# Patient Record
Sex: Male | Born: 1940 | Race: White | Hispanic: No | Marital: Married | Smoking: Former smoker
Health system: Southern US, Community
[De-identification: ages and names within clinical notes are randomized; demographics above are authoritative.]

## PROBLEM LIST (undated history)

## (undated) DIAGNOSIS — C801 Malignant (primary) neoplasm, unspecified: Secondary | ICD-10-CM

## (undated) HISTORY — PX: INGUINAL HERNIA REPAIR: SUR1180

---

## 2007-07-24 ENCOUNTER — Ambulatory Visit: Admission: RE | Admit: 2007-07-24 | Discharge: 2007-10-22 | Payer: Self-pay | Admitting: Radiation Oncology

## 2007-08-19 ENCOUNTER — Encounter: Admission: RE | Admit: 2007-08-19 | Discharge: 2007-08-19 | Payer: Self-pay | Admitting: Urology

## 2007-09-11 ENCOUNTER — Ambulatory Visit (HOSPITAL_BASED_OUTPATIENT_CLINIC_OR_DEPARTMENT_OTHER): Admission: RE | Admit: 2007-09-11 | Discharge: 2007-09-11 | Payer: Self-pay | Admitting: Urology

## 2010-06-28 NOTE — Op Note (Signed)
Ryan Meyers, Ryan Meyers             ACCOUNT NO.:  [id number]   MEDICAL RECORD NO.:  [id number]          PATIENT TYPE:  AMB   LOCATION:  [facility]                         FACILITY:  [facility]   PHYSICIAN:  Mark C. Vernie Ammons, M.D.  DATE OF BIRTH:  03/07/1940   DATE OF PROCEDURE:  09/11/2007  DATE OF DISCHARGE:                               OPERATIVE REPORT   PREOPERATIVE DIAGNOSIS:  Adenocarcinoma of the prostate.   POSTOPERATIVE DIAGNOSIS:  Adenocarcinoma of the prostate.   PROCEDURE:  I-125 seed implantation.   SURGEON:  Mark C. Vernie Ammons, M.D.   RADIATION ONCOLOGIST:  Maryln Gottron, M.D.   ANESTHESIA:  General.   SPECIMENS:  None.   DRAINS:  16-French Foley catheter.   BLOOD LOSS:  Minimal.   COMPLICATIONS:  None.   INDICATIONS:  The patient is a 70 year old white male with biopsy-proven  adenocarcinoma of the prostate after a PSA of 6 was detected.  His DRE  revealed no nodularity.  The biopsy revealed adenocarcinoma, Gleason  score 3+3 in all of the cores that were positive.  The patient was  counseled on the treatment options as well as risks and complications  associated with seed implantation and has elected to proceed with the  above-named procedure.   DESCRIPTION OF OPERATION:  After informed consent, the patient was  brought to the major O.R., placed on the table, and administered general  anesthesia, then moved to the modified lithotomy position, with his  perineum perpendicular to the floor.  A rectal tube was placed, as well  as Foley catheter, and the balloon was filled with dilute contrast.  Next, the transrectal ultrasound probe was placed in the rectum and then  affixed to the stand.  Real-time ultrasound planning was then performed  by Dr. Dayton Scrape.  After the plan was complete, I proceeded with seed  implantation.   The seeds were implanted according to the real-time plan previously  performed using the grid and real-time ultrasound.  The Nucletron was  used to  build and dispense the seeds through the needles.  This  proceeded without difficulty or complication.  After implanting a total  of 75 seeds using 35 needles, the anchoring needles were removed, and  the transrectal ultrasound probe was removed from the rectum as well as  the rectal tube.  Fluoroscopy was used to observe placement and location  of the seeds.  The Foley catheter was then removed as well.   Flexible cystoscopy was then performed using a 17-French flexible scope.  This was passed per urethra under direct visualization, which was noted  to be normal.  The sphincter appeared intact.  The prostate revealed  elongation of the prostatic urethra, but there were no prostatic  urethral lesions.  No seeds were identified within the prostatic  urethra.  Upon entering the bladder, I note the bladder was lined with  normal-appearing pink mucosa and was fully and systematically inspected.  No tumor, stones, or inflammatory lesions were identified.  Ureteral  orifices were noted to be of normal configuration and position.  Retroflexion of the scope revealed no seeds protruding from the base  of  the prostate or other abnormality.  The cystoscope was therefore  removed, and a new 16-French Foley catheter was inserted in the bladder  and connected to closed-system drainage.  A sterile occlusive dressing  was applied to the perineum, and the patient was awakened and taken to  the recovery room in stable, satisfactory condition.  He tolerated the  procedure well, with no intraoperative complications.   He will be given a prescription for 24 Vicodin; Flomax 0.4 mg, #30; and  500 mg Cipro to take b.i.d. for 5 days.  He will follow up in my office  and with Dr. Dayton Scrape in 3 weeks, and will remove his Foley catheter at  home.  Written instructions outlining this were given to the patient  upon discharge.      Mark C. Vernie Ammons, M.D.  Electronically Signed     MCO/MEDQ  D:  09/11/2007  T:   09/11/2007  Job:  [id number]   cc:   Maryln Gottron, M.D.  Fax: [phone]

## 2010-11-11 LAB — CBC
HCT: 42.7
Hemoglobin: 14.7
MCHC: 34.4
MCV: 88.8
Platelets: 170
RBC: 4.81
RDW: 11.9
WBC: 5.4

## 2010-11-11 LAB — COMPREHENSIVE METABOLIC PANEL
ALT: 18
AST: 17
Albumin: 4
Alkaline Phosphatase: 76
BUN: 11
CO2: 30
Calcium: 9.5
Chloride: 106
Creatinine, Ser: 0.86
GFR calc Af Amer: >60
GFR calc non Af Amer: >60
Glucose, Bld: 102 — ABNORMAL HIGH
Potassium: 5.2 — ABNORMAL HIGH
Sodium: 139
Total Bilirubin: 0.9
Total Protein: 6

## 2010-11-11 LAB — PROTIME-INR
INR: 1
Prothrombin Time: 13.5

## 2010-11-11 LAB — APTT: aPTT: 34

## 2012-03-01 DIAGNOSIS — Z8546 Personal history of malignant neoplasm of prostate: Secondary | ICD-10-CM | POA: Diagnosis not present

## 2012-03-08 DIAGNOSIS — Z8546 Personal history of malignant neoplasm of prostate: Secondary | ICD-10-CM | POA: Diagnosis not present

## 2012-07-12 DIAGNOSIS — Z79899 Other long term (current) drug therapy: Secondary | ICD-10-CM | POA: Diagnosis not present

## 2012-07-12 DIAGNOSIS — E7800 Pure hypercholesterolemia, unspecified: Secondary | ICD-10-CM | POA: Diagnosis not present

## 2012-07-12 DIAGNOSIS — Z0000 Encounter for general adult medical examination without abnormal findings: Secondary | ICD-10-CM | POA: Diagnosis not present

## 2012-09-16 DIAGNOSIS — H25099 Other age-related incipient cataract, unspecified eye: Secondary | ICD-10-CM | POA: Diagnosis not present

## 2012-09-16 DIAGNOSIS — H18419 Arcus senilis, unspecified eye: Secondary | ICD-10-CM | POA: Diagnosis not present

## 2012-10-07 DIAGNOSIS — D126 Benign neoplasm of colon, unspecified: Secondary | ICD-10-CM | POA: Diagnosis not present

## 2012-10-07 DIAGNOSIS — Z86010 Personal history of colonic polyps: Secondary | ICD-10-CM | POA: Diagnosis not present

## 2012-10-07 DIAGNOSIS — Z09 Encounter for follow-up examination after completed treatment for conditions other than malignant neoplasm: Secondary | ICD-10-CM | POA: Diagnosis not present

## 2012-10-07 DIAGNOSIS — K648 Other hemorrhoids: Secondary | ICD-10-CM | POA: Diagnosis not present

## 2013-03-07 DIAGNOSIS — Z8546 Personal history of malignant neoplasm of prostate: Secondary | ICD-10-CM | POA: Diagnosis not present

## 2013-03-14 DIAGNOSIS — Z8546 Personal history of malignant neoplasm of prostate: Secondary | ICD-10-CM | POA: Diagnosis not present

## 2013-07-18 DIAGNOSIS — Z0000 Encounter for general adult medical examination without abnormal findings: Secondary | ICD-10-CM | POA: Diagnosis not present

## 2013-07-18 DIAGNOSIS — D485 Neoplasm of uncertain behavior of skin: Secondary | ICD-10-CM | POA: Diagnosis not present

## 2013-07-18 DIAGNOSIS — Z23 Encounter for immunization: Secondary | ICD-10-CM | POA: Diagnosis not present

## 2013-07-18 DIAGNOSIS — E7800 Pure hypercholesterolemia, unspecified: Secondary | ICD-10-CM | POA: Diagnosis not present

## 2013-07-18 DIAGNOSIS — Z8546 Personal history of malignant neoplasm of prostate: Secondary | ICD-10-CM | POA: Diagnosis not present

## 2013-09-05 DIAGNOSIS — H25099 Other age-related incipient cataract, unspecified eye: Secondary | ICD-10-CM | POA: Diagnosis not present

## 2014-03-09 DIAGNOSIS — Z8546 Personal history of malignant neoplasm of prostate: Secondary | ICD-10-CM | POA: Diagnosis not present

## 2014-03-16 DIAGNOSIS — Z8546 Personal history of malignant neoplasm of prostate: Secondary | ICD-10-CM | POA: Diagnosis not present

## 2014-06-25 DIAGNOSIS — M7021 Olecranon bursitis, right elbow: Secondary | ICD-10-CM | POA: Diagnosis not present

## 2014-06-25 DIAGNOSIS — L03119 Cellulitis of unspecified part of limb: Secondary | ICD-10-CM | POA: Diagnosis not present

## 2014-06-30 DIAGNOSIS — M7021 Olecranon bursitis, right elbow: Secondary | ICD-10-CM | POA: Diagnosis not present

## 2014-07-06 DIAGNOSIS — M7021 Olecranon bursitis, right elbow: Secondary | ICD-10-CM | POA: Diagnosis not present

## 2014-07-22 DIAGNOSIS — E780 Pure hypercholesterolemia: Secondary | ICD-10-CM | POA: Diagnosis not present

## 2014-07-22 DIAGNOSIS — Z0000 Encounter for general adult medical examination without abnormal findings: Secondary | ICD-10-CM | POA: Diagnosis not present

## 2014-08-29 ENCOUNTER — Emergency Department (HOSPITAL_COMMUNITY): Payer: Medicare Other

## 2014-08-29 ENCOUNTER — Encounter (HOSPITAL_COMMUNITY): Payer: Self-pay | Admitting: *Deleted

## 2014-08-29 ENCOUNTER — Observation Stay (HOSPITAL_COMMUNITY)
Admission: EM | Admit: 2014-08-29 | Discharge: 2014-08-30 | Disposition: A | Discharge status: Home / Self Care | Payer: Medicare Other | Attending: Internal Medicine | Admitting: Internal Medicine

## 2014-08-29 DIAGNOSIS — R079 Chest pain, unspecified: Secondary | ICD-10-CM | POA: Diagnosis not present

## 2014-08-29 DIAGNOSIS — IMO0001 Reserved for inherently not codable concepts without codable children: Secondary | ICD-10-CM | POA: Diagnosis present

## 2014-08-29 DIAGNOSIS — E041 Nontoxic single thyroid nodule: Secondary | ICD-10-CM

## 2014-08-29 DIAGNOSIS — R0789 Other chest pain: Secondary | ICD-10-CM | POA: Diagnosis not present

## 2014-08-29 DIAGNOSIS — E785 Hyperlipidemia, unspecified: Secondary | ICD-10-CM | POA: Diagnosis not present

## 2014-08-29 DIAGNOSIS — R030 Elevated blood-pressure reading, without diagnosis of hypertension: Secondary | ICD-10-CM | POA: Diagnosis not present

## 2014-08-29 DIAGNOSIS — Z8546 Personal history of malignant neoplasm of prostate: Secondary | ICD-10-CM | POA: Insufficient documentation

## 2014-08-29 DIAGNOSIS — J439 Emphysema, unspecified: Secondary | ICD-10-CM | POA: Diagnosis present

## 2014-08-29 DIAGNOSIS — J8410 Pulmonary fibrosis, unspecified: Secondary | ICD-10-CM | POA: Diagnosis not present

## 2014-08-29 DIAGNOSIS — M549 Dorsalgia, unspecified: Secondary | ICD-10-CM | POA: Diagnosis present

## 2014-08-29 HISTORY — DX: Malignant (primary) neoplasm, unspecified: C80.1

## 2014-08-29 LAB — CBC
HCT: 43.6 % (ref 39.0–52.0)
HEMOGLOBIN: 14.9 g/dL (ref 13.0–17.0)
MCH: 30.7 pg (ref 26.0–34.0)
MCHC: 34.2 g/dL (ref 30.0–36.0)
MCV: 89.7 fL (ref 78.0–100.0)
Platelets: 164 10*3/uL (ref 150–400)
RBC: 4.86 MIL/uL (ref 4.22–5.81)
RDW: 12.3 % (ref 11.5–15.5)
WBC: 7 10*3/uL (ref 4.0–10.5)

## 2014-08-29 LAB — BASIC METABOLIC PANEL
Anion gap: 5 (ref 5–15)
BUN: 18 mg/dL (ref 6–20)
CO2: 28 mmol/L (ref 22–32)
Calcium: 9.1 mg/dL (ref 8.9–10.3)
Chloride: 109 mmol/L (ref 101–111)
Creatinine, Ser: 1.08 mg/dL (ref 0.61–1.24)
GFR calc Af Amer: >60 mL/min (ref >60)
GLUCOSE: 120 mg/dL — AB (ref 65–99)
POTASSIUM: 4.3 mmol/L (ref 3.5–5.1)
SODIUM: 142 mmol/L (ref 135–145)

## 2014-08-29 LAB — I-STAT TROPONIN, ED
Troponin i, poc: 0 ng/mL (ref 0.00–0.08)
Troponin i, poc: 0 ng/mL (ref 0.00–0.08)

## 2014-08-29 LAB — D-DIMER, QUANTITATIVE: D-Dimer, Quant: 0.87 ug/mL-FEU — ABNORMAL HIGH (ref 0.00–0.48)

## 2014-08-29 MED ORDER — IOHEXOL 350 MG/ML SOLN
80.0000 mL | Freq: Once | INTRAVENOUS | Status: AC | PRN
  Administered 2014-08-29: 80 mL via INTRAVENOUS

## 2014-08-29 NOTE — ED Provider Notes (Signed)
CSN: [id number]     Arrival date & time 08/29/14  1533 History   First MD Initiated Contact with Patient 08/29/14 1745     Chief Complaint  Patient presents with  . Back Pain     (Consider location/radiation/quality/duration/timing/severity/associated sxs/prior Treatment) HPI Patient presents to the emergency department with chest pressure and started this afternoon while at home.  The patient states that he has had intermittent chest pressure this afternoon.  He has had some back pain just below the scapula started with tingling in his left arm today.  The patient states that nothing seems make his condition, better or worse.  Patient denies shortness breath, nausea, vomiting, diaphoresis, weakness, dizziness, lightheadedness, abdominal pain, bloody stool, hematemesis, or syncope.  The patient states that nothing seems make the condition better or worse.  Patient states his last stress test was in the late 80s, early 90s Past Medical History  Diagnosis Date  . Cancer     prostate cancer in 2009   History reviewed. No pertinent past surgical history. No family history on file. History  Substance Use Topics  . Smoking status: Never Smoker   . Smokeless tobacco: Not on file  . Alcohol Use: No    Review of Systems All other systems negative except as documented in the HPI. All pertinent positives and negatives as reviewed in the HPI.    Allergies  Review of patient's allergies indicates no known allergies.  Home Medications   Prior to Admission medications   Not on File   BP 140/65 mmHg  Pulse 66  Temp(Src) 97.8 F (36.6 C) (Oral)  Resp 23  Ht 5\' 9"  (1.753 m)  Wt 150 lb (68.04 kg)  BMI 22.14 kg/m2  SpO2 98% Physical Exam  Constitutional: He is oriented to person, place, and time. He appears well-developed and well-nourished. No distress.  HENT:  Head: Normocephalic and atraumatic.  Mouth/Throat: Oropharynx is clear and moist.  Eyes: Pupils are equal, round, and  reactive to light.  Neck: Normal range of motion. Neck supple.  Cardiovascular: Normal rate, regular rhythm and normal heart sounds.  Exam reveals no gallop and no friction rub.   No murmur heard. Pulmonary/Chest: Effort normal and breath sounds normal. No respiratory distress.  Musculoskeletal: He exhibits no edema.  Neurological: He is alert and oriented to person, place, and time. He exhibits normal muscle tone. Coordination normal.  Skin: Skin is warm and dry. No rash noted. No erythema.  Psychiatric: He has a normal mood and affect. His behavior is normal.  Nursing note and vitals reviewed.   ED Course  Procedures (including critical care time) Labs Review Labs Reviewed  BASIC METABOLIC PANEL - Abnormal; Notable for the following:    Glucose, Bld 120 (*)    All other components within normal limits  D-DIMER, QUANTITATIVE (NOT AT [facility]) - Abnormal; Notable for the following:    D-Dimer, Quant 0.87 (*)    All other components within normal limits  CBC  I-STAT TROPOININ, ED  Randolm Idol, ED    Imaging Review Dg Chest 2 View  08/29/2014   CLINICAL DATA:  74 year old male with posterior chest tightness, pain. Tingling in the left upper extremity. Initial encounter.  EXAM: CHEST  2 VIEW  COMPARISON:  08/19/2007.  FINDINGS: Stable lung volumes. Cardiac size is at the upper limits of normal. Other mediastinal contours are within normal limits. Visualized tracheal air column is within normal limits. No pneumothorax, pulmonary edema, pleural effusion or confluent pulmonary opacity. Small surgical  clips at the right anterior chest wall or anterior right lung. No acute osseous abnormality identified.  IMPRESSION: No acute cardiopulmonary abnormality.   Electronically Signed   By: Genevie Ann M.D.   On: 08/29/2014 18:00       EKG Interpretation  Date/Time:  Saturday August 29 2014 23:00:01 EDT Ventricular Rate:  64 PR Interval:  140 QRS Duration: 87 QT Interval:  392 QTC  Calculation: 404 R Axis:   -52 Text Interpretation:  Sinus rhythm Left anterior fascicular block No significant change since last tracing Confirmed by KNAPP  MD-J, JON ([id number]) on 08/29/2014 11:31:40 PM        Patient be admitted for cardiac rule out.  Patient is advised plan and all questions were answered  Dalia Heading, PA-C 08/29/14 2355

## 2014-08-29 NOTE — H&P (Signed)
PCP: Donnie Coffin, MD  [facility] [place]  Referring provider lawyer   Chief Complaint:   Chest and back pain  HPI: Ryan Meyers is a 74 y.o. male   has a past medical history of Cancer.   Presented with  Back and Chest pain. Patient started to have back pain under left shoulder blade for 1 week. He has had a dull ache there.  Around same time he developed chest tightness like indigestion. The sensation comes and goes. Occurs at rest not worse with activity, not worse with deep breathing. Lasts less than a minute. He quit smoking 30 years ago. Father died of massive MI in his 39's. Patient presented to ER . Troponin 0.00 x2. CTA no PE by showing emphysema and thyroid nodule.  Denies any fever no chills, no nausea no vomiting.   Patient has history of prostate cancer treated by urology status post seed implantation in 2009  Hospitalist was called for admission for atypical  chest pain with Heart score   Review of Systems:    Pertinent positives include:  chest pain, back pain  Constitutional:  No weight loss, night sweats, Fevers, chills, fatigue, weight loss  HEENT:  No headaches, Difficulty swallowing,Tooth/dental problems,Sore throat,  No sneezing, itching, ear ache, nasal congestion, post nasal drip,  Cardio-vascular:  No  Orthopnea, PND, anasarca, dizziness, palpitations.no Bilateral lower extremity swelling  GI:  No heartburn, indigestion, abdominal pain, nausea, vomiting, diarrhea, change in bowel habits, loss of appetite, melena, blood in stool, hematemesis Resp:  no shortness of breath at rest. No dyspnea on exertion, No excess mucus, no productive cough, No non-productive cough, No coughing up of blood.No change in color of mucus.No wheezing. Skin:  no rash or lesions. No jaundice GU:  no dysuria, change in color of urine, no urgency or frequency. No straining to urinate.  No flank pain.  Musculoskeletal:  No joint pain or no joint swelling. No decreased  range of motion. No back pain.  Psych:  No change in mood or affect. No depression or anxiety. No memory loss.  Neuro: no localizing neurological complaints, no tingling, no weakness, no double vision, no gait abnormality, no slurred speech, no confusion  Otherwise ROS are negative except for above, 10 systems were reviewed  Past Medical History: Past Medical History  Diagnosis Date  . Cancer     prostate cancer in 2009   History reviewed. No pertinent past surgical history.   Medications: Prior to Admission medications   Medication Sig Start Date End Date Taking? Authorizing Provider  simvastatin (ZOCOR) 20 MG tablet Take 20 mg by mouth at bedtime.   Yes Historical Provider, MD    Allergies:  No Known Allergies  Social History:  Ambulatory   independently   Lives at home   With family     reports that he has never smoked. He does not have any smokeless tobacco history on file. He reports that he does not drink alcohol or use illicit drugs.    Family History: family history is not on file.    Physical Exam: Patient Vitals for the past 24 hrs:  BP Temp Temp src Pulse Resp SpO2 Height Weight  08/29/14 1900 140/65 mmHg - - 66 23 98 % - -  08/29/14 1800 154/75 mmHg - - 82 23 100 % - -  08/29/14 1547 174/78 mmHg 97.8 F (36.6 C) Oral 94 18 100 % 5\' 9"  (1.753 m) 68.04 kg (150 lb)    1. General:  in No Acute distress 2. Psychological: Alert and    Oriented 3. Head/ENT:     Dry Mucous Membranes                          Head Non traumatic, neck supple                          Normal   Dentition 4. SKIN: normal  Skin turgor,  Skin clean Dry and intact no rash 5. Heart: Regular rate and rhythm no Murmur, Rub or gallop 6. Lungs:   no wheezes or crackles  slightly distant 7. Abdomen: Soft, non-tender, Non distended 8. Lower extremities: no clubbing, cyanosis, or edema 9. Neurologically Grossly intact, moving all 4 extremities equally 10. MSK: Normal range of motion back  pain reproducible by palpation  body mass index is 22.14 kg/(m^2).   Labs on Admission:   Results for orders placed or performed during the hospital encounter of 08/29/14 (from the past 24 hour(s))  Basic metabolic panel     Status: Abnormal   Collection Time: 08/29/14  4:05 PM  Result Value Ref Range   Sodium 142 135 - 145 mmol/L   Potassium 4.3 3.5 - 5.1 mmol/L   Chloride 109 101 - 111 mmol/L   CO2 28 22 - 32 mmol/L   Glucose, Bld 120 (H) 65 - 99 mg/dL   BUN 18 6 - 20 mg/dL   Creatinine, Ser 1.08 0.61 - 1.24 mg/dL   Calcium 9.1 8.9 - 10.3 mg/dL   GFR calc non Af Amer >60 >60 mL/min   GFR calc Af Amer >60 >60 mL/min   Anion gap 5 5 - 15  CBC     Status: None   Collection Time: 08/29/14  4:05 PM  Result Value Ref Range   WBC 7.0 4.0 - 10.5 K/uL   RBC 4.86 4.22 - 5.81 MIL/uL   Hemoglobin 14.9 13.0 - 17.0 g/dL   HCT 43.6 39.0 - 52.0 %   MCV 89.7 78.0 - 100.0 fL   MCH 30.7 26.0 - 34.0 pg   MCHC 34.2 30.0 - 36.0 g/dL   RDW 12.3 11.5 - 15.5 %   Platelets 164 150 - 400 K/uL  D-dimer, quantitative     Status: Abnormal   Collection Time: 08/29/14  6:35 PM  Result Value Ref Range   D-Dimer, Quant 0.87 (H) 0.00 - 0.48 ug/mL-FEU  I-stat troponin, ED     Status: None   Collection Time: 08/29/14  6:46 PM  Result Value Ref Range   Troponin i, poc 0.00 0.00 - 0.08 ng/mL   Comment 3          I-stat troponin, ED     Status: None   Collection Time: 08/29/14 10:08 PM  Result Value Ref Range   Troponin i, poc 0.00 0.00 - 0.08 ng/mL   Comment 3            U not obtained  No results found for: HGBA1C  Estimated Creatinine Clearance: 60.3 mL/min (by C-G formula based on Cr of 1.08).  BNP (last 3 results) No results for input(s): PROBNP in the last 8760 hours.  Other results:  I have pearsonaly reviewed this: ECG REPORT  Rate: 64  Rhythm: Left anterior fascicular block ST&T Change: no ischemic changes QTC 404   Filed Weights   08/29/14 1547  Weight: 68.04 kg (150 lb)  Cultures: No results found for: SDES, SPECREQUEST, CULT, REPTSTATUS   Radiological Exams on Admission: Dg Chest 2 View  08/29/2014   CLINICAL DATA:  74 year old male with posterior chest tightness, pain. Tingling in the left upper extremity. Initial encounter.  EXAM: CHEST  2 VIEW  COMPARISON:  08/19/2007.  FINDINGS: Stable lung volumes. Cardiac size is at the upper limits of normal. Other mediastinal contours are within normal limits. Visualized tracheal air column is within normal limits. No pneumothorax, pulmonary edema, pleural effusion or confluent pulmonary opacity. Small surgical clips at the right anterior chest wall or anterior right lung. No acute osseous abnormality identified.  IMPRESSION: No acute cardiopulmonary abnormality.   Electronically Signed   By: Genevie Ann M.D.   On: 08/29/2014 18:00    Chart has been reviewed  Family   at  Bedside  plan of care was discussed with Wife Randale Carvalho [phone]  Assessment/Plan  74 year old gentleman with history of prostate cancer and hyperlipidemia with family history of massive MI in his father and and his 43s. Presents with atypical chest pain. Troponins negative EKG without acute changes. Heart score 4 been admitted for further evaluation. CT scan of the chest incidentally found diffuse emphysema and thyroid nodule     Present on Admission:  . Chest pain - somewhat atypical - given risk factors will admit, monitor on telemetry, cycle cardiac enzymes, obtain serial ECG. Further risk stratify with lipid panel, hgA1C, obtain TSH. Make sure patient is on Aspirin. Further treatment based on the currently pending results.  . Hyperlipidemia - check lipid panel continuous patent  . Thyroid nodule check TSH, T4, and T3, soft tissue ultrasound of the neck  . Elevated BP - continue to follow if persists will need initiation of antihypertensives  . Emphysema of lung - she'll former smoker currently has been tobacco free for the past 30  years. Reports no wheezing or shortness of breath. No oxygen requirement. Continue to monitor   back pain -  appears to be musculoskeletal  Prophylaxis:  Lovenox   CODE STATUS:  FULL CODE  as per patient   Disposition: To home once workup is complete and patient is stable  Other plan as per orders.  I have spent a total of 55 min on this admission  Moris Ratchford 08/29/2014, 11:02 PM  [facility]  Pager [phone]   after 2 AM please page floor coverage PA If 7AM-7PM, please contact the day team taking care of the patient  [email or web address]  Password [email or web address]

## 2014-08-29 NOTE — ED Notes (Signed)
The pt is c/o some upper back or posterior chest pain for one week  At 1330 today he experienced some lt arm tingling   And decided that he may need to be checked.  The tingling in his lt arm is almost gone.  Speech clear gait steady

## 2014-08-29 NOTE — ED Provider Notes (Signed)
Medical screening examination/treatment/procedure(s) were conducted as a shared visit with non-physician practitioner(s) and myself.  I personally evaluated the patient during the encounter.  EKG Normal sinus rhythm rate 97 Left anterior fascicular block nOn specific ST changes No prior EKG for comparison  Pt presents to the ED with chest pressure.  EKG abnormal but no STEMI.  Troponin normal.  Heart score 4.  Moderate risk.  Will consult with medical service for serial cardiac enzymes, further evaluation.  Dorie Rank, MD 08/30/14 [phone]

## 2014-08-29 NOTE — ED Notes (Signed)
Attempted report 

## 2014-08-30 ENCOUNTER — Encounter (HOSPITAL_COMMUNITY): Payer: Self-pay | Admitting: Cardiology

## 2014-08-30 ENCOUNTER — Observation Stay (HOSPITAL_COMMUNITY): Payer: Medicare Other

## 2014-08-30 DIAGNOSIS — E785 Hyperlipidemia, unspecified: Secondary | ICD-10-CM

## 2014-08-30 DIAGNOSIS — R072 Precordial pain: Secondary | ICD-10-CM

## 2014-08-30 DIAGNOSIS — J439 Emphysema, unspecified: Secondary | ICD-10-CM | POA: Diagnosis not present

## 2014-08-30 DIAGNOSIS — R030 Elevated blood-pressure reading, without diagnosis of hypertension: Secondary | ICD-10-CM

## 2014-08-30 DIAGNOSIS — E041 Nontoxic single thyroid nodule: Secondary | ICD-10-CM | POA: Diagnosis not present

## 2014-08-30 DIAGNOSIS — Z8546 Personal history of malignant neoplasm of prostate: Secondary | ICD-10-CM | POA: Diagnosis not present

## 2014-08-30 DIAGNOSIS — R079 Chest pain, unspecified: Secondary | ICD-10-CM | POA: Diagnosis not present

## 2014-08-30 LAB — CBC
HEMATOCRIT: 40 % (ref 39.0–52.0)
Hemoglobin: 14 g/dL (ref 13.0–17.0)
MCH: 31.1 pg (ref 26.0–34.0)
MCHC: 35 g/dL (ref 30.0–36.0)
MCV: 88.9 fL (ref 78.0–100.0)
Platelets: 140 10*3/uL — ABNORMAL LOW (ref 150–400)
RBC: 4.5 MIL/uL (ref 4.22–5.81)
RDW: 12.2 % (ref 11.5–15.5)
WBC: 6.5 10*3/uL (ref 4.0–10.5)

## 2014-08-30 LAB — CREATININE, SERUM
CREATININE: 0.91 mg/dL (ref 0.61–1.24)
GFR calc Af Amer: >60 mL/min (ref >60)

## 2014-08-30 LAB — LIPID PANEL
Cholesterol: 166 mg/dL (ref 0–200)
HDL: 58 mg/dL (ref >40)
LDL CALC: 95 mg/dL (ref 0–99)
TRIGLYCERIDES: 66 mg/dL (ref <150)
Total CHOL/HDL Ratio: 2.9 RATIO
VLDL: 13 mg/dL (ref 0–40)

## 2014-08-30 LAB — T4, FREE: Free T4: 0.97 ng/dL (ref 0.61–1.12)

## 2014-08-30 LAB — TROPONIN I: Troponin I: <0.03 ng/mL (ref <0.031)

## 2014-08-30 LAB — TSH: TSH: 2.115 u[IU]/mL (ref 0.350–4.500)

## 2014-08-30 MED ORDER — MORPHINE SULFATE 2 MG/ML IJ SOLN: 2.0000 mg | INTRAMUSCULAR | Status: DC | PRN

## 2014-08-30 MED ORDER — SIMVASTATIN 20 MG PO TABS
20.0000 mg | ORAL_TABLET | Freq: Every day | ORAL | Status: DC
  Administered 2014-08-30: 20 mg via ORAL
  Filled 2014-08-30: qty 1

## 2014-08-30 MED ORDER — AMLODIPINE BESYLATE 5 MG PO TABS
5.0000 mg | ORAL_TABLET | Freq: Every day | ORAL | Status: DC
  Administered 2014-08-30: 5 mg via ORAL
  Filled 2014-08-30: qty 1

## 2014-08-30 MED ORDER — ONDANSETRON HCL 4 MG/2ML IJ SOLN: 4.0000 mg | Freq: Four times a day (QID) | INTRAMUSCULAR | Status: DC | PRN

## 2014-08-30 MED ORDER — PANTOPRAZOLE SODIUM 40 MG PO TBEC: 40.0000 mg | DELAYED_RELEASE_TABLET | Freq: Every day | ORAL | Status: AC

## 2014-08-30 MED ORDER — ENOXAPARIN SODIUM 40 MG/0.4ML ~~LOC~~ SOLN: 40.0000 mg | SUBCUTANEOUS | Status: DC

## 2014-08-30 MED ORDER — ACETAMINOPHEN 325 MG PO TABS: 650.0000 mg | ORAL_TABLET | ORAL | Status: DC | PRN

## 2014-08-30 MED ORDER — AMLODIPINE BESYLATE 5 MG PO TABS: 5.0000 mg | ORAL_TABLET | Freq: Every day | ORAL | Status: AC

## 2014-08-30 MED ORDER — ASPIRIN EC 325 MG PO TBEC
325.0000 mg | DELAYED_RELEASE_TABLET | Freq: Every day | ORAL | Status: DC
  Administered 2014-08-30: 325 mg via ORAL
  Filled 2014-08-30: qty 1

## 2014-08-30 NOTE — Progress Notes (Signed)
[facility]                                                                              Patient Demographics  Ryan Meyers, is a 74 y.o. male, DOB - 21-Nov-1940, FUX:[id number]  Admit date - 08/29/2014   Admitting Physician Toy Baker, MD  Outpatient Primary MD for the patient is Donnie Coffin, MD  LOS - 1   Chief Complaint  Patient presents with  . Back Pain       Brief HPI   Patient is a 74 year old male presented with chest pain and back pain. Patient has no prior cardiac history or cardiac workup. He reported back pain under his left shoulder blade for 1 week and around the same time he developed chest tightness, felt like indigestion. He reported that as intermittent, occurring at rest but not worsening with activity or deep breathing. However he was concerned yesterday as the chest pain got worse and his left hand was feeling numb and he presented to ED. CT angiogram of the chest showed no PE but emphysema and thyroid nodule. Patient was admitted for further workup.   Assessment & Plan    Principal Problem:   Chest pain: No prior cardiac history or any other risk factors except family history of coronary artery disease - Patient ruled out for acute ACS, currently chest pain resolved, serial troponins negative. CT in 2 g of the chest was negative for pulmonary embolism - Discussed with cardiology,, Dr. Percival Spanish, recommended exercise treadmill test today   Active Problems:   Hyperlipidemia - LDL 95, continue Zocor    Thyroid nodule - Seen incidentally on CT angiogram of the chest, TSH and free T4 normal, ultrasound of the thyroid pending     Elevated BP: Patient states that he does not have a history of hypertension -  consistent BP readings have been elevated, will place on Norvasc     Emphysema of lung - Currently stable, no wheezing   Code Status: Full code   Family Communication: Discussed in detail with the patient, all imaging  results, lab results explained to the patient    Disposition Plan: If treadmill test and thyroid ultrasound within normal limits, hopefully DC home today   Time Spent in minutes  25 minutes  Procedures  Treadmill stress test   thyroid ultrasound  Consults   Cardiology   DVT Prophylaxis Lovenox   Medications  Scheduled Meds: . aspirin EC  325 mg Oral Daily  . enoxaparin (LOVENOX) injection  40 mg Subcutaneous Q24H  . simvastatin  20 mg Oral QHS   Continuous Infusions:  PRN Meds:.acetaminophen, morphine injection, ondansetron (ZOFRAN) IV   Antibiotics   Anti-infectives    None        Subjective:   Ryan Meyers was seen and examined today. Patient denies dizziness, chest pain, shortness of breath, abdominal pain, N/V/D/C, new weakness, numbess, tingling. No acute events overnight.    Objective:   Blood pressure 142/62, pulse 65, temperature 97.7 F (36.5 C), temperature source Oral, resp. rate 16, height 5' 9.5" (1.765 m), weight 65.817 kg (145 lb 1.6 oz), SpO2 99 %.  Wt  Readings from Last 3 Encounters:  08/30/14 65.817 kg (145 lb 1.6 oz)     Intake/Output Summary (Last 24 hours) at 08/30/14 1107 Last data filed at 08/30/14 0500  Gross per 24 hour  Intake      0 ml  Output    750 ml  Net   -750 ml    Exam  General: Alert and oriented x 3, NAD  HEENT:  PERRLA, EOMI, Anicteric Sclera, mucous membranes moist.   Neck: Supple, no JVD, no masses  CVS: S1 S2 auscultated, no rubs, murmurs or gallops. Regular rate and rhythm.  Respiratory: Clear to auscultation bilaterally, no wheezing, rales or rhonchi  Abdomen: Soft, nontender, nondistended, + bowel sounds  Ext: no cyanosis clubbing or edema  Neuro: AAOx3, Cr N's II- XII. Strength 5/5 upper and lower extremities bilaterally  Skin: No rashes  Psych: Normal affect and demeanor, alert and oriented x3    Data Review   Micro Results No results found for this or any previous visit (from the past  240 hour(s)).  Radiology Reports Dg Chest 2 View  08/29/2014   CLINICAL DATA:  74 year old male with posterior chest tightness, pain. Tingling in the left upper extremity. Initial encounter.  EXAM: CHEST  2 VIEW  COMPARISON:  08/19/2007.  FINDINGS: Stable lung volumes. Cardiac size is at the upper limits of normal. Other mediastinal contours are within normal limits. Visualized tracheal air column is within normal limits. No pneumothorax, pulmonary edema, pleural effusion or confluent pulmonary opacity. Small surgical clips at the right anterior chest wall or anterior right lung. No acute osseous abnormality identified.  IMPRESSION: No acute cardiopulmonary abnormality.   Electronically Signed   By: Genevie Ann M.D.   On: 08/29/2014 18:00   Ct Angio Chest Pe W/cm &/or Wo Cm  08/29/2014   CLINICAL DATA:  Upper back and posterior chest pain for 1 week. Left arm tingling at 1330 hours today.  EXAM: CT ANGIOGRAPHY CHEST WITH CONTRAST  TECHNIQUE: Multidetector CT imaging of the chest was performed using the standard protocol during bolus administration of intravenous contrast. Multiplanar CT image reconstructions and MIPs were obtained to evaluate the vascular anatomy.  CONTRAST:  93mL OMNIPAQUE IOHEXOL 350 MG/ML SOLN  COMPARISON:  None.  FINDINGS: Technically adequate study with good opacification of the central and segmental pulmonary arteries. No focal filling defects demonstrated. No evidence of significant pulmonary embolus.  Normal heart size. Normal caliber thoracic aorta. No aortic dissection. Great vessel origins are patent. Esophagus is decompressed. No significant lymphadenopathy in the chest. Mild prominence of pericardial recess. Hypodense nodule in the right thyroid measuring 2.5 cm diameter. Based on size, consider thyroid ultrasound for further evaluation.  Emphysematous changes throughout the lungs. Atelectasis or fibrosis in the lung bases. No focal airspace disease or consolidation. Scarring in the  lung apices. No pneumothorax. No pleural effusions. Calcified granulomas in the lungs.  Included portions of the upper abdominal organs demonstrate no gross abnormality. Degenerative changes in the spine. No destructive bone lesions.  Review of the MIP images confirms the above findings.  IMPRESSION: No evidence of significant pulmonary embolus. Emphysematous changes and scattered fibrosis in the lungs. No evidence of active pulmonary disease. 2.5 cm diameter right thyroid nodule. Consider ultrasound further evaluation.   Electronically Signed   By: Lucienne Capers M.D.   On: 08/29/2014 23:01    CBC  Recent Labs Lab 08/29/14 1605 08/30/14 0145  WBC 7.0 6.5  HGB 14.9 14.0  HCT 43.6 40.0  PLT 164  140*  MCV 89.7 88.9  MCH 30.7 31.1  MCHC 34.2 35.0  RDW 12.3 12.2    Chemistries   Recent Labs Lab 08/29/14 1605 08/30/14 0145  NA 142  --   K 4.3  --   CL 109  --   CO2 28  --   GLUCOSE 120*  --   BUN 18  --   CREATININE 1.08 0.91  CALCIUM 9.1  --    ------------------------------------------------------------------------------------------------------------------ estimated creatinine clearance is 69.3 mL/min (by C-G formula based on Cr of 0.91). ------------------------------------------------------------------------------------------------------------------ No results for input(s): HGBA1C in the last 72 hours. ------------------------------------------------------------------------------------------------------------------  Recent Labs  08/30/14 0440  CHOL 166  HDL 58  LDLCALC 95  TRIG 66  CHOLHDL 2.9   ------------------------------------------------------------------------------------------------------------------  Recent Labs  08/30/14 0440  TSH 2.115   ------------------------------------------------------------------------------------------------------------------ No results for input(s): VITAMINB12, FOLATE, FERRITIN, TIBC, IRON, RETICCTPCT in the last 72  hours.  Coagulation profile No results for input(s): INR, PROTIME in the last 168 hours.   Recent Labs  08/29/14 1835  DDIMER 0.87*    Cardiac Enzymes  Recent Labs Lab 08/30/14 0440  TROPONINI <0.03   ------------------------------------------------------------------------------------------------------------------ Invalid input(s): POCBNP  No results for input(s): GLUCAP in the last 72 hours.   RAI,RIPUDEEP M.D. [facility] 08/30/2014, 11:07 AM  Pager: [phone] Between 7am to 7pm - call Pager - [phone]  After 7pm go to [email or web address] - password [email or web address]  Call night coverage person covering after 7pm

## 2014-08-30 NOTE — Discharge Summary (Signed)
Physician Discharge Summary   Patient ID: HURIEL MATT MRN: [id number] DOB/AGE: [birth year] 74 y.o.  Admit date: 08/29/2014 Discharge date: 08/30/2014  Primary Care Physician:  Donnie Coffin, MD  Discharge Diagnoses:    . Atypical Chest pain . Hyperlipidemia . Right Thyroid nodule . Elevated BP . Emphysema of lung  Consults:  Cardiology, Dr Percival Spanish   Recommendations for Outpatient Follow-up:  CT chest showed 2.5 cm diameter right thyroid nodule  Thyroid US showed : 3.1 cm right mid/lower pole thyroid nodule with calcifications. Findings meet consensus criteria for biopsy on an outpatient basis.  Please arrange thyroid biopsy outpatient. TSH and free T4 are with in normal range     DIET:  Heart healthy diet    Allergies:  No Known Allergies   Discharge Medications:   Medication List    TAKE these medications        amLODipine 5 MG tablet  Commonly known as:  NORVASC  Take 1 tablet (5 mg total) by mouth daily.     pantoprazole 40 MG tablet  Commonly known as:  PROTONIX  Take 1 tablet (40 mg total) by mouth daily.     simvastatin 20 MG tablet  Commonly known as:  ZOCOR  Take 20 mg by mouth at bedtime.         Brief H and P: For complete details please refer to admission H and P, but in brief Patient is a 74 year old male presented with chest pain and back pain. Patient has no prior cardiac history or cardiac workup. He reported back pain under his left shoulder blade for 1 week and around the same time he developed chest tightness, felt like indigestion. He reported that as intermittent, occurring at rest but not worsening with activity or deep breathing. However he was concerned yesterday as the chest pain got worse and his left hand was feeling numb and he presented to ED. CT angiogram of the chest showed no PE but emphysema and thyroid nodule. Patient was admitted for further workup.  Hospital Course:  Chest pain: No prior cardiac history or any  other risk factors except family history of coronary artery disease - Patient ruled out for acute ACS,  chest pain resolved, serial troponins remained negative. CTA chest was negative for pulmonary embolism. Cardiology was consulted and Dr Percival Spanish recommended treadmill stress test which was normal.    Hyperlipidemia - LDL 95, continue Zocor   Thyroid nodule - Seen incidentally on CT angiogram of the chest, TSH and free T4 normal, ultrasound of the thyroid showed 3.1 cm right mid/lower pole thyroid nodule. Thyroid biopsy will be arranged by PCP outpatient.   Elevated BP: Patient states that he does not have a history of hypertension - consistent BP readings have been elevated, placed on Norvasc    Emphysema of lung - Currently stable, no wheezing    Day of Discharge BP 142/62 mmHg  Pulse 65  Temp(Src) 97.7 F (36.5 C) (Oral)  Resp 16  Ht 5' 9.5" (1.765 m)  Wt 65.817 kg (145 lb 1.6 oz)  BMI 21.13 kg/m2  SpO2 99%  Physical Exam: General: Alert and awake oriented x3 not in any acute distress. HEENT: anicteric sclera, pupils reactive to light and accommodation CVS: S1-S2 clear no murmur rubs or gallops Chest: clear to auscultation bilaterally, no wheezing rales or rhonchi Abdomen: soft nontender, nondistended, normal bowel sounds Extremities: no cyanosis, clubbing or edema noted bilaterally Neuro: Cranial nerves II-XII intact, no focal neurological deficits   The results  of significant diagnostics from this hospitalization (including imaging, microbiology, ancillary and laboratory) are listed below for reference.    LAB RESULTS: Basic Metabolic Panel:  Recent Labs Lab 08/29/14 1605 08/30/14 0145  NA 142  --   K 4.3  --   CL 109  --   CO2 28  --   GLUCOSE 120*  --   BUN 18  --   CREATININE 1.08 0.91  CALCIUM 9.1  --    Liver Function Tests: No results for input(s): AST, ALT, ALKPHOS, BILITOT, PROT, ALBUMIN in the last 168 hours. No results for input(s):  LIPASE, AMYLASE in the last 168 hours. No results for input(s): AMMONIA in the last 168 hours. CBC:  Recent Labs Lab 08/29/14 1605 08/30/14 0145  WBC 7.0 6.5  HGB 14.9 14.0  HCT 43.6 40.0  MCV 89.7 88.9  PLT 164 140*   Cardiac Enzymes:  Recent Labs Lab 08/30/14 0440 08/30/14 1054  TROPONINI <0.03 <0.03   BNP: Invalid input(s): POCBNP CBG: No results for input(s): GLUCAP in the last 168 hours.  Significant Diagnostic Studies:  Dg Chest 2 View  08/29/2014   CLINICAL DATA:  74 year old male with posterior chest tightness, pain. Tingling in the left upper extremity. Initial encounter.  EXAM: CHEST  2 VIEW  COMPARISON:  08/19/2007.  FINDINGS: Stable lung volumes. Cardiac size is at the upper limits of normal. Other mediastinal contours are within normal limits. Visualized tracheal air column is within normal limits. No pneumothorax, pulmonary edema, pleural effusion or confluent pulmonary opacity. Small surgical clips at the right anterior chest wall or anterior right lung. No acute osseous abnormality identified.  IMPRESSION: No acute cardiopulmonary abnormality.   Electronically Signed   By: Genevie Ann M.D.   On: 08/29/2014 18:00   Ct Angio Chest Pe W/cm &/or Wo Cm  08/29/2014   CLINICAL DATA:  Upper back and posterior chest pain for 1 week. Left arm tingling at 1330 hours today.  EXAM: CT ANGIOGRAPHY CHEST WITH CONTRAST  TECHNIQUE: Multidetector CT imaging of the chest was performed using the standard protocol during bolus administration of intravenous contrast. Multiplanar CT image reconstructions and MIPs were obtained to evaluate the vascular anatomy.  CONTRAST:  40mL OMNIPAQUE IOHEXOL 350 MG/ML SOLN  COMPARISON:  None.  FINDINGS: Technically adequate study with good opacification of the central and segmental pulmonary arteries. No focal filling defects demonstrated. No evidence of significant pulmonary embolus.  Normal heart size. Normal caliber thoracic aorta. No aortic dissection.  Great vessel origins are patent. Esophagus is decompressed. No significant lymphadenopathy in the chest. Mild prominence of pericardial recess. Hypodense nodule in the right thyroid measuring 2.5 cm diameter. Based on size, consider thyroid ultrasound for further evaluation.  Emphysematous changes throughout the lungs. Atelectasis or fibrosis in the lung bases. No focal airspace disease or consolidation. Scarring in the lung apices. No pneumothorax. No pleural effusions. Calcified granulomas in the lungs.  Included portions of the upper abdominal organs demonstrate no gross abnormality. Degenerative changes in the spine. No destructive bone lesions.  Review of the MIP images confirms the above findings.  IMPRESSION: No evidence of significant pulmonary embolus. Emphysematous changes and scattered fibrosis in the lungs. No evidence of active pulmonary disease. 2.5 cm diameter right thyroid nodule. Consider ultrasound further evaluation.   Electronically Signed   By: Lucienne Capers M.D.   On: 08/29/2014 23:01   US Soft Tissue Head/neck  08/30/2014   CLINICAL DATA:  Right thyroid nodule on CT  EXAM: THYROID ULTRASOUND  TECHNIQUE: Ultrasound examination of the thyroid gland and adjacent soft tissues was performed.  COMPARISON:  None.  FINDINGS: Right thyroid lobe  Measurements: 4.3 x 2.0 x 3.3 cm. 3.1 x 1.8 x 2.9 cm mid/lower pole thyroid nodule with calcifications.  Left thyroid lobe  Measurements: 3.9 x 1.7 x 1.3 cm.  No nodules visualized.  Isthmus  Thickness: 3 mm.  No nodules visualized.  Lymphadenopathy  None visualized.  IMPRESSION: 3.1 cm right mid/lower pole thyroid nodule with calcifications.  Findings meet consensus criteria for biopsy on an outpatient basis.  Ultrasound-guided fine needle aspiration should be considered, as per the consensus statement: Management of Thyroid Nodules Detected at [place]: Society of Radiologists in [place]. Radiology 2005; [id number].    Electronically Signed   By: Julian Hy M.D.   On: 08/30/2014 12:25       Disposition and Follow-up: Discharge Instructions    Diet - low sodium heart healthy    Complete by:  As directed      Discharge instructions    Complete by:  As directed   Please discuss with your primary physician to arrange for thyroid gland biopsy for the nodule.     Increase activity slowly    Complete by:  As directed             DISPOSITION: HOME    DISCHARGE FOLLOW-UP Follow-up Information    Follow up with Donnie Coffin, MD. Schedule an appointment as soon as possible for a visit in 10 days.   Specialty:  Family Medicine   Why:  for hospital follow-up. Please discuss with Dr Alroy Dust regarding thyroid biopsy.   Contact information:   [address] [place] [address] [phone]        Time spent on Discharge: 29 mins   Signed:   RAI,RIPUDEEP M.D. [facility] 08/30/2014, 2:32 PM Pager: [phone]

## 2014-08-30 NOTE — ED Notes (Signed)
Attempted to give report 

## 2014-08-30 NOTE — Progress Notes (Signed)
GXT competed. Patient reached maximum HR, and continued to exercise up to 105% of predicted without chest pain. METS of 9.9. Hypertensive response. No acute ST-T-wave changes indicative of ischemia. Some down-sloping due to LV strain.  Normal GXT.

## 2014-08-30 NOTE — Consult Note (Signed)
CARDIOLOGY CONSULT NOTE  Patient ID: Ryan Meyers MRN: [id number] DOB/AGE: 72-Dec-1942 72 y.o.  Admit date: 08/29/2014 Primary Physician Donnie Coffin, MD Primary Cardiologist None Chief Complaint  Chest pain  HPI:  The patient presents for evaluation of chest pain.  He has no past cardiac history.  However, he had chest pain yesterday.  This was mid chest and mild.  He had no radiation to his jaw or shoulder although he did have some left hand tingling.  This lasted only for about one minute.  He came to the ED and he did have a brief episode of this their as well.  He otherwise as done OK.  In the ED he had no EKG changes.  He has had no associated symptoms such as nausea or SOB.  Enzymes x 3 have been negative.  He is otherwise an active person.  He can exercise without bringing on these symptoms.  The patient denies any new symptoms such as chest discomfort, neck or arm discomfort. There has been no new shortness of breath, PND or orthopnea. There have been no reported palpitations, presyncope or syncope.  Past Medical History  Diagnosis Date  . Cancer     prostate cancer in 2009    History reviewed. No pertinent past surgical history.  No Known Allergies Prescriptions prior to admission  Medication Sig Dispense Refill Last Dose  . simvastatin (ZOCOR) 20 MG tablet Take 20 mg by mouth at bedtime.   08/28/2014 at Unknown time   Family History  Problem Relation Age of Onset  . CAD Father   . Leukemia Sister   . Hypertension Brother   . CAD Brother     History   Social History  . Marital Status: Married    Spouse Name: N/A  . Number of Children: N/A  . Years of Education: N/A   Occupational History  . Not on file.   Social History Main Topics  . Smoking status: Former Research scientist (life sciences)  . Smokeless tobacco: Not on file  . Alcohol Use: No  . Drug Use: No  . Sexual Activity: Not on file   Other Topics Concern  . Not on file   Social History Narrative  . No narrative on  file     ROS:    As stated in the HPI and negative for all other systems.  Physical Exam: Blood pressure 149/70, pulse 61, temperature 97.6 F (36.4 C), temperature source Oral, resp. rate 16, height 5' 9.5" (1.765 m), weight 145 lb 1.6 oz (65.817 kg), SpO2 100 %.  GENERAL:  Well appearing HEENT:  Pupils equal round and reactive, fundi not visualized, oral mucosa unremarkable NECK:  No jugular venous distention, waveform within normal limits, carotid upstroke brisk and symmetric, no bruits, no thyromegaly LYMPHATICS:  No cervical, inguinal adenopathy LUNGS:  Clear to auscultation bilaterally BACK:  No CVA tenderness CHEST:  Unremarkable HEART:  PMI not displaced or sustained,S1 and S2 within normal limits, no S3, no S4, no clicks, no rubs, no murmurs ABD:  Flat, positive bowel sounds normal in frequency in pitch, no bruits, no rebound, no guarding, no midline pulsatile mass, no hepatomegaly, no splenomegaly EXT:  2 plus pulses throughout, no edema, no cyanosis no clubbing SKIN:  No rashes no nodules NEURO:  Cranial nerves II through XII grossly intact, motor grossly intact throughout PSYCH:  Cognitively intact, oriented to person place and time  Labs: Lab Results  Component Value Date   BUN 18 08/29/2014   Lab Results  Component Value Date   CREATININE 0.91 08/30/2014   Lab Results  Component Value Date   NA 142 08/29/2014   K 4.3 08/29/2014   CL 109 08/29/2014   CO2 28 08/29/2014   Lab Results  Component Value Date   TROPONINI <0.03 08/30/2014   Lab Results  Component Value Date   WBC 6.5 08/30/2014   HGB 14.0 08/30/2014   HCT 40.0 08/30/2014   MCV 88.9 08/30/2014   PLT 140* 08/30/2014   Lab Results  Component Value Date   CHOL 166 08/30/2014   HDL 58 08/30/2014   LDLCALC 95 08/30/2014   TRIG 66 08/30/2014   CHOLHDL 2.9 08/30/2014    Radiology:   CXR:  Stable lung volumes. Cardiac size is at the upper limits of normal. Other mediastinal contours are within  normal limits. Visualized tracheal air column is within normal limits. No pneumothorax, pulmonary edema, pleural effusion or confluent pulmonary opacity. Small surgical clips at the right anterior chest wall or anterior right lung. No acute osseous abnormality identified.  EKG:  NSR, rate 64, LAD, poor anterior R wave progression.  No acute ST T wave changes.  08/30/2014  ASSESSMENT AND PLAN:   CHEST PAIN:  Low pretest probability of obstructive CAD.  Given his family history of CAD I will plan a POET (Plain Old Exercise Treadmill)  ABNORMAL CT:  Question thyroid nodule.  Plan per primary team.  (Note the CXR suggests surgical clips but he has not had any chest surgeries.    SignedMinus Breeding 08/30/2014, 7:34 AM

## 2014-08-31 LAB — T3: T3, Total: 115 ng/dL (ref 71–180)

## 2014-09-02 ENCOUNTER — Other Ambulatory Visit: Payer: Self-pay | Admitting: Family Medicine

## 2014-09-02 DIAGNOSIS — E041 Nontoxic single thyroid nodule: Secondary | ICD-10-CM

## 2014-09-17 ENCOUNTER — Other Ambulatory Visit: Payer: Medicare Other

## 2014-09-29 ENCOUNTER — Ambulatory Visit
Admission: RE | Admit: 2014-09-29 | Discharge: 2014-09-29 | Disposition: A | Discharge status: Home / Self Care | Payer: Medicare Other | Source: Ambulatory Visit | Attending: Family Medicine | Admitting: Family Medicine

## 2014-09-29 ENCOUNTER — Other Ambulatory Visit (HOSPITAL_COMMUNITY)
Admission: RE | Admit: 2014-09-29 | Discharge: 2014-09-29 | Disposition: A | Discharge status: Home / Self Care | Payer: Medicare Other | Source: Ambulatory Visit | Attending: Interventional Radiology | Admitting: Interventional Radiology

## 2014-09-29 DIAGNOSIS — E041 Nontoxic single thyroid nodule: Secondary | ICD-10-CM

## 2014-11-02 DIAGNOSIS — H25099 Other age-related incipient cataract, unspecified eye: Secondary | ICD-10-CM | POA: Diagnosis not present

## 2015-03-12 DIAGNOSIS — Z8546 Personal history of malignant neoplasm of prostate: Secondary | ICD-10-CM | POA: Diagnosis not present

## 2015-03-19 DIAGNOSIS — Z0000 Encounter for general adult medical examination without abnormal findings: Secondary | ICD-10-CM | POA: Diagnosis not present

## 2015-03-19 DIAGNOSIS — Z8546 Personal history of malignant neoplasm of prostate: Secondary | ICD-10-CM | POA: Diagnosis not present

## 2015-07-23 DIAGNOSIS — Z8546 Personal history of malignant neoplasm of prostate: Secondary | ICD-10-CM | POA: Diagnosis not present

## 2015-07-23 DIAGNOSIS — E7800 Pure hypercholesterolemia, unspecified: Secondary | ICD-10-CM | POA: Diagnosis not present

## 2015-07-23 DIAGNOSIS — Z0000 Encounter for general adult medical examination without abnormal findings: Secondary | ICD-10-CM | POA: Diagnosis not present

## 2015-12-24 DIAGNOSIS — H25099 Other age-related incipient cataract, unspecified eye: Secondary | ICD-10-CM | POA: Diagnosis not present

## 2016-07-24 DIAGNOSIS — Z125 Encounter for screening for malignant neoplasm of prostate: Secondary | ICD-10-CM | POA: Diagnosis not present

## 2016-07-24 DIAGNOSIS — E7800 Pure hypercholesterolemia, unspecified: Secondary | ICD-10-CM | POA: Diagnosis not present

## 2016-07-24 DIAGNOSIS — Z8546 Personal history of malignant neoplasm of prostate: Secondary | ICD-10-CM | POA: Diagnosis not present

## 2016-07-24 DIAGNOSIS — Z0000 Encounter for general adult medical examination without abnormal findings: Secondary | ICD-10-CM | POA: Diagnosis not present

## 2016-10-11 IMAGING — US US SOFT TISSUE HEAD/NECK
1 series · 14 of 25 positions shown · non-contrast
Comparison: None.

CLINICAL DATA: Right thyroid nodule on CT

EXAM:
THYROID ULTRASOUND
TECHNIQUE: Ultrasound examination of the thyroid gland and adjacent soft
tissues was performed.

[Series 1: us soft tissue head/neck · 0.07mm/px · 14 of 50 slices shown]
[im 1/50]
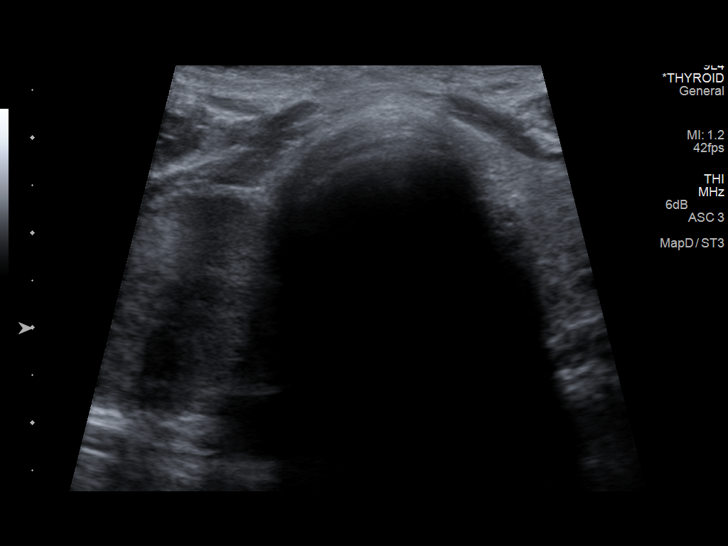
[im 5/50]
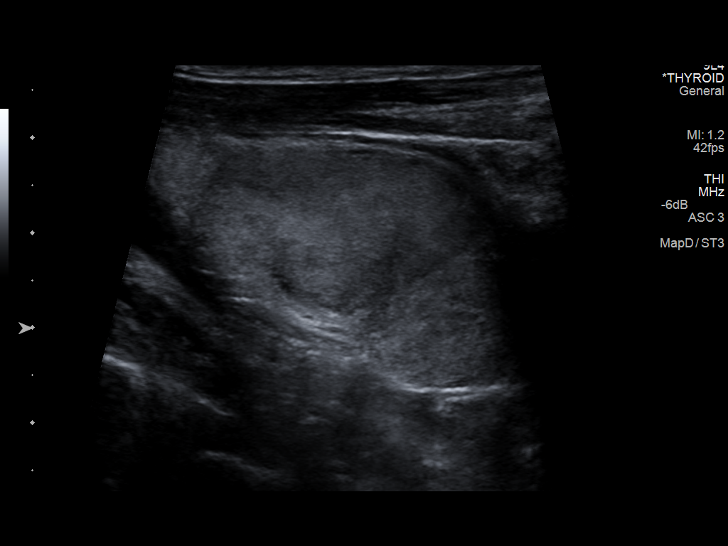
[im 9/50]
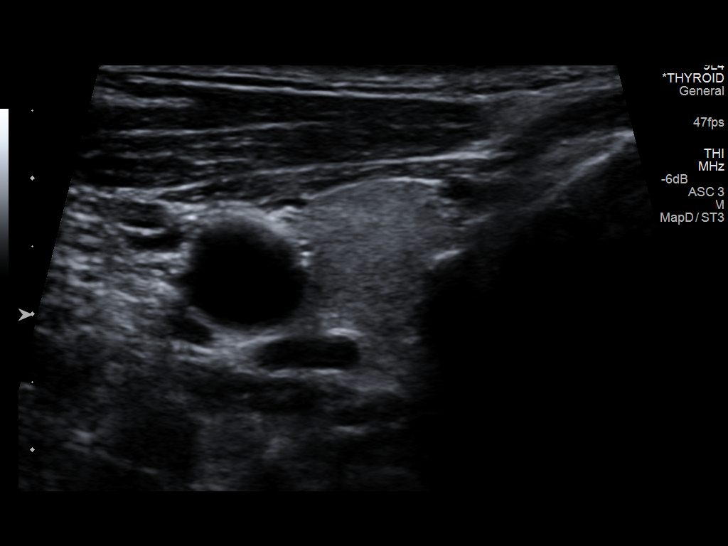
[im 13/50]
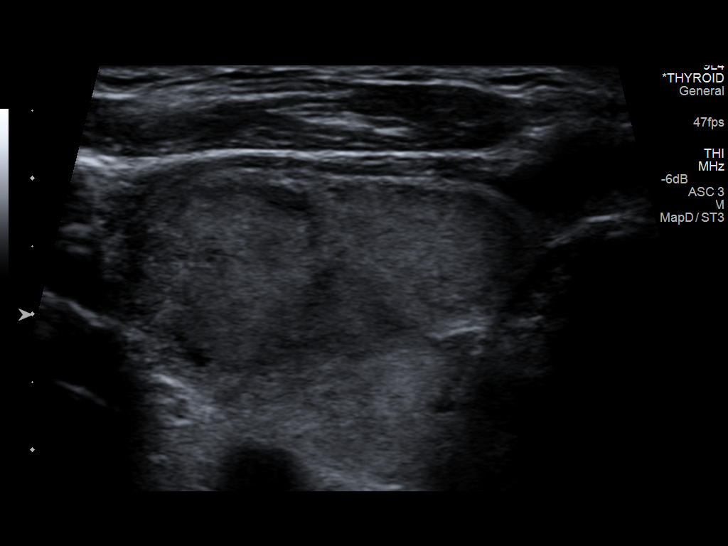
[im 17/50]
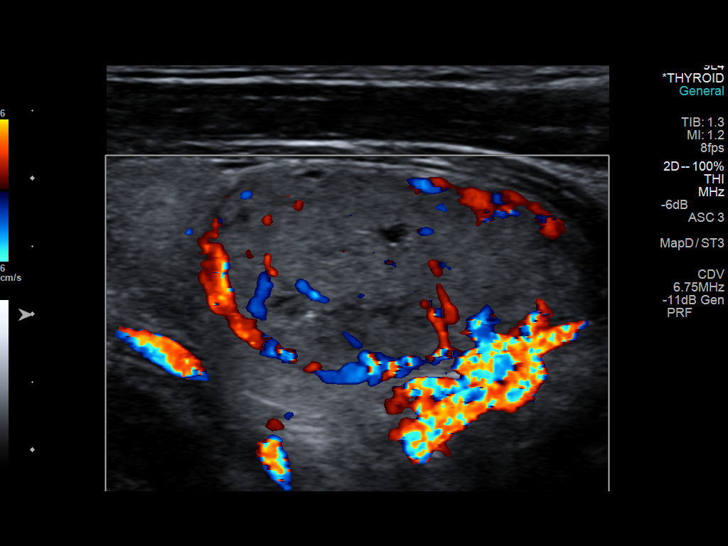
[im 19/50]
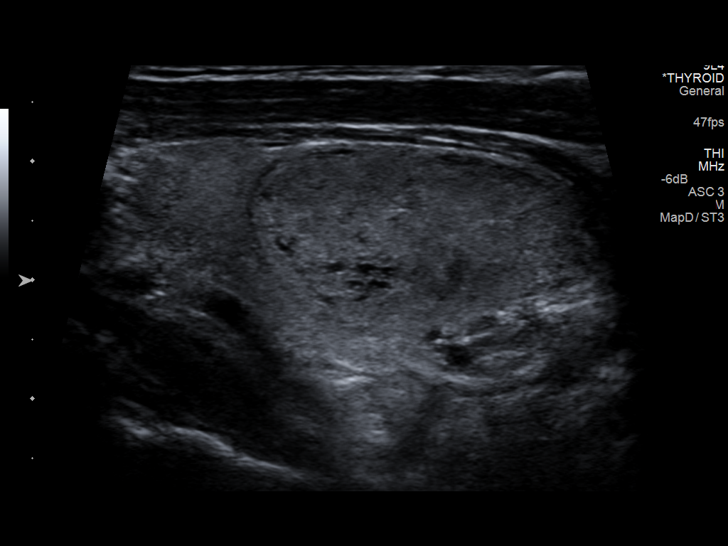
[im 23/50]
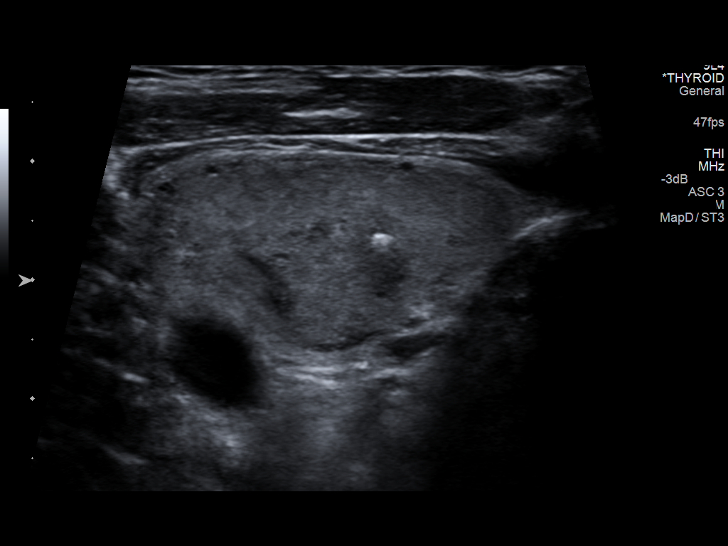
[im 27/50]
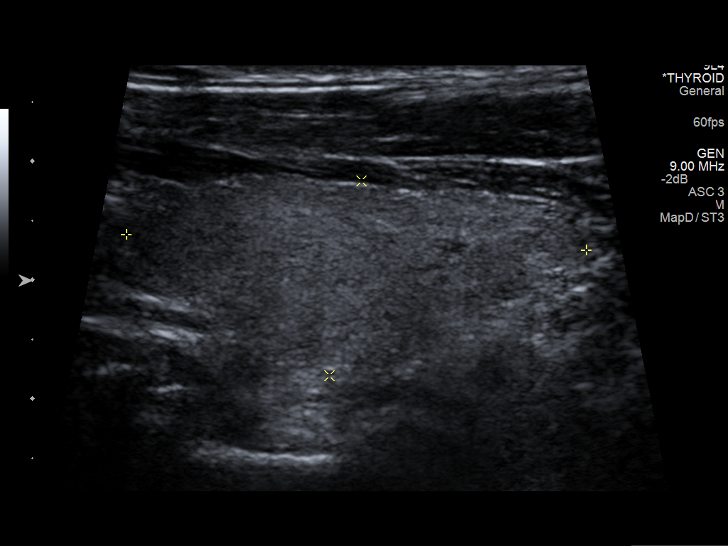
[im 31/50]
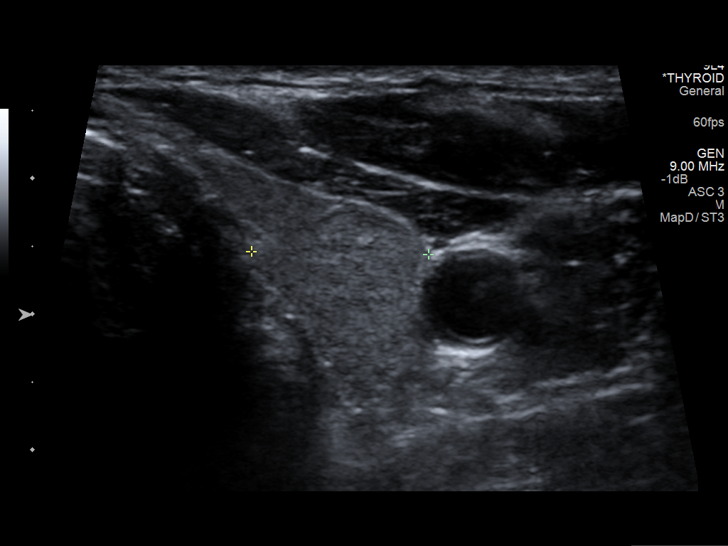
[im 33/50]
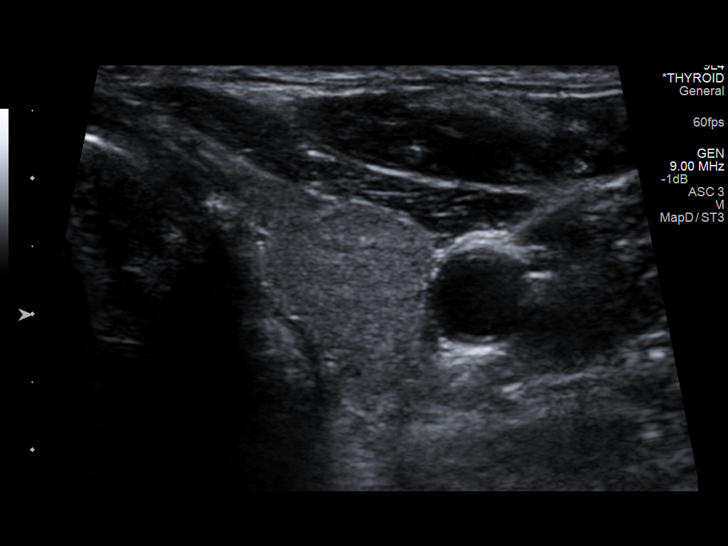
[im 37/50]
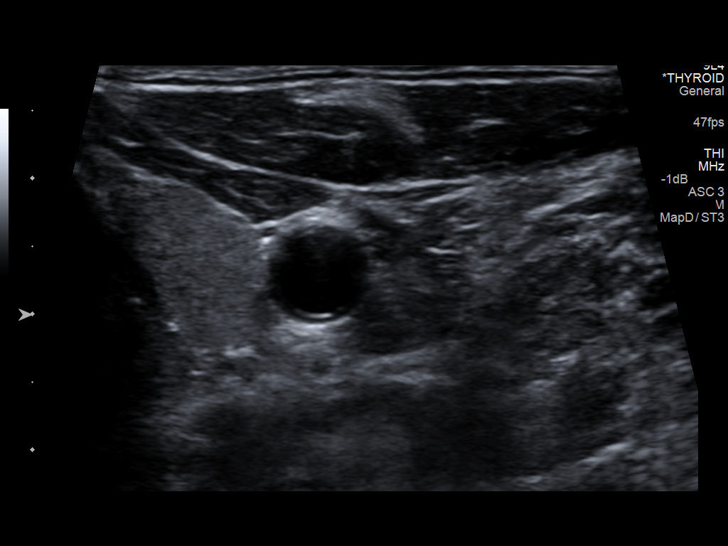
[im 41/50]
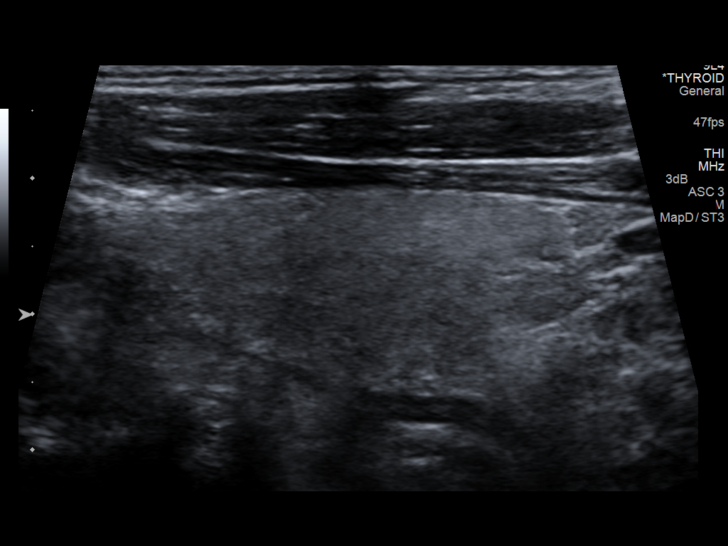
[im 45/50]
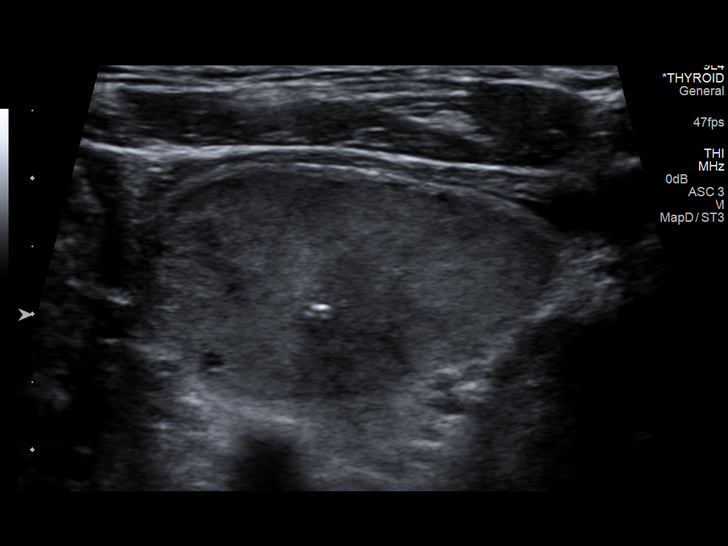
[im 50/50]
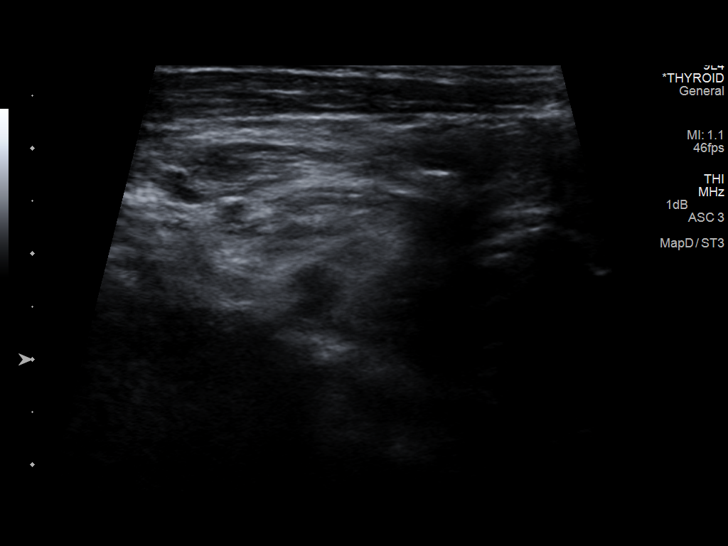

[14 of 25 positions shown; findings below may reference images not displayed]

FINDINGS: Right thyroid lobe

Measurements: 4.3 x 2.0 x 3.3 cm. 3.1 x 1.8 x 2.9 cm mid/lower pole
thyroid nodule with calcifications.

Left thyroid lobe

Measurements: 3.9 x 1.7 x 1.3 cm.  No nodules visualized.

Isthmus

Thickness: 3 mm.  No nodules visualized.

Lymphadenopathy

None visualized.
IMPRESSION: 3.1 cm right mid/lower pole thyroid nodule with calcifications.

Findings meet consensus criteria for biopsy on an outpatient basis.

Ultrasound-guided fine needle aspiration should be considered, as
per the consensus statement: Management of Thyroid Nodules Detected
at [place]: Society of Radiologists in Ultrasound Consensus Conference

## 2017-03-23 DIAGNOSIS — H18419 Arcus senilis, unspecified eye: Secondary | ICD-10-CM | POA: Diagnosis not present

## 2017-06-26 DIAGNOSIS — H6120 Impacted cerumen, unspecified ear: Secondary | ICD-10-CM | POA: Diagnosis not present

## 2017-07-25 DIAGNOSIS — S60419A Abrasion of unspecified finger, initial encounter: Secondary | ICD-10-CM | POA: Diagnosis not present

## 2017-07-25 DIAGNOSIS — Z0000 Encounter for general adult medical examination without abnormal findings: Secondary | ICD-10-CM | POA: Diagnosis not present

## 2017-07-25 DIAGNOSIS — S61219A Laceration without foreign body of unspecified finger without damage to nail, initial encounter: Secondary | ICD-10-CM | POA: Diagnosis not present

## 2017-07-25 DIAGNOSIS — Z125 Encounter for screening for malignant neoplasm of prostate: Secondary | ICD-10-CM | POA: Diagnosis not present

## 2017-07-25 DIAGNOSIS — Z8546 Personal history of malignant neoplasm of prostate: Secondary | ICD-10-CM | POA: Diagnosis not present

## 2017-07-25 DIAGNOSIS — E7800 Pure hypercholesterolemia, unspecified: Secondary | ICD-10-CM | POA: Diagnosis not present

## 2017-11-21 DIAGNOSIS — Z23 Encounter for immunization: Secondary | ICD-10-CM | POA: Diagnosis not present

## 2018-01-03 DIAGNOSIS — D128 Benign neoplasm of rectum: Secondary | ICD-10-CM | POA: Diagnosis not present

## 2018-01-03 DIAGNOSIS — K5730 Diverticulosis of large intestine without perforation or abscess without bleeding: Secondary | ICD-10-CM | POA: Diagnosis not present

## 2018-01-03 DIAGNOSIS — Z86010 Personal history of colonic polyps: Secondary | ICD-10-CM | POA: Diagnosis not present

## 2018-01-03 DIAGNOSIS — K640 First degree hemorrhoids: Secondary | ICD-10-CM | POA: Diagnosis not present

## 2018-01-08 DIAGNOSIS — D128 Benign neoplasm of rectum: Secondary | ICD-10-CM | POA: Diagnosis not present

## 2018-08-05 DIAGNOSIS — Z8546 Personal history of malignant neoplasm of prostate: Secondary | ICD-10-CM | POA: Diagnosis not present

## 2018-08-05 DIAGNOSIS — E7800 Pure hypercholesterolemia, unspecified: Secondary | ICD-10-CM | POA: Diagnosis not present

## 2018-08-05 DIAGNOSIS — Z0000 Encounter for general adult medical examination without abnormal findings: Secondary | ICD-10-CM | POA: Diagnosis not present

## 2018-08-06 DIAGNOSIS — Z8546 Personal history of malignant neoplasm of prostate: Secondary | ICD-10-CM | POA: Diagnosis not present

## 2018-08-06 DIAGNOSIS — E7800 Pure hypercholesterolemia, unspecified: Secondary | ICD-10-CM | POA: Diagnosis not present

## 2018-11-13 DIAGNOSIS — Z23 Encounter for immunization: Secondary | ICD-10-CM | POA: Diagnosis not present

## 2019-02-14 ENCOUNTER — Emergency Department (HOSPITAL_COMMUNITY): Payer: Medicare Other

## 2019-02-14 ENCOUNTER — Other Ambulatory Visit: Payer: Self-pay

## 2019-02-14 ENCOUNTER — Emergency Department (HOSPITAL_COMMUNITY)
Admission: EM | Admit: 2019-02-14 | Discharge: 2019-02-15 | Disposition: A | Discharge status: Home / Self Care | Payer: Medicare Other | Attending: Emergency Medicine | Admitting: Emergency Medicine

## 2019-02-14 ENCOUNTER — Encounter (HOSPITAL_COMMUNITY): Payer: Self-pay | Admitting: Emergency Medicine

## 2019-02-14 DIAGNOSIS — N200 Calculus of kidney: Secondary | ICD-10-CM | POA: Diagnosis not present

## 2019-02-14 DIAGNOSIS — Z79899 Other long term (current) drug therapy: Secondary | ICD-10-CM | POA: Insufficient documentation

## 2019-02-14 DIAGNOSIS — I714 Abdominal aortic aneurysm, without rupture, unspecified: Secondary | ICD-10-CM

## 2019-02-14 DIAGNOSIS — Z87891 Personal history of nicotine dependence: Secondary | ICD-10-CM | POA: Diagnosis not present

## 2019-02-14 DIAGNOSIS — R300 Dysuria: Secondary | ICD-10-CM | POA: Diagnosis present

## 2019-02-14 DIAGNOSIS — Z8546 Personal history of malignant neoplasm of prostate: Secondary | ICD-10-CM | POA: Diagnosis not present

## 2019-02-14 DIAGNOSIS — N132 Hydronephrosis with renal and ureteral calculous obstruction: Secondary | ICD-10-CM | POA: Diagnosis not present

## 2019-02-14 LAB — COMPREHENSIVE METABOLIC PANEL
ALT: 19 U/L (ref 0–44)
AST: 21 U/L (ref 15–41)
Albumin: 4.3 g/dL (ref 3.5–5.0)
Alkaline Phosphatase: 85 U/L (ref 38–126)
Anion gap: 13 (ref 5–15)
BUN: 27 mg/dL — ABNORMAL HIGH (ref 8–23)
CO2: 24 mmol/L (ref 22–32)
Calcium: 9.1 mg/dL (ref 8.9–10.3)
Chloride: 106 mmol/L (ref 98–111)
Creatinine, Ser: 1.18 mg/dL (ref 0.61–1.24)
GFR calc Af Amer: >60 mL/min (ref >60)
GFR calc non Af Amer: 60 mL/min — ABNORMAL LOW (ref >60)
Glucose, Bld: 157 mg/dL — ABNORMAL HIGH (ref 70–99)
Potassium: 4 mmol/L (ref 3.5–5.1)
Sodium: 143 mmol/L (ref 135–145)
Total Bilirubin: 0.9 mg/dL (ref 0.3–1.2)
Total Protein: 6.8 g/dL (ref 6.5–8.1)

## 2019-02-14 LAB — CBC WITH DIFFERENTIAL/PLATELET
Abs Immature Granulocytes: 0.09 10*3/uL — ABNORMAL HIGH (ref 0.00–0.07)
Basophils Absolute: 0 10*3/uL (ref 0.0–0.1)
Basophils Relative: 0 %
Eosinophils Absolute: 0 10*3/uL (ref 0.0–0.5)
Eosinophils Relative: 0 %
HCT: 43.6 % (ref 39.0–52.0)
Hemoglobin: 14.5 g/dL (ref 13.0–17.0)
Immature Granulocytes: 1 %
Lymphocytes Relative: 8 %
Lymphs Abs: 1.2 10*3/uL (ref 0.7–4.0)
MCH: 30.9 pg (ref 26.0–34.0)
MCHC: 33.3 g/dL (ref 30.0–36.0)
MCV: 93 fL (ref 80.0–100.0)
Monocytes Absolute: 0.6 10*3/uL (ref 0.1–1.0)
Monocytes Relative: 4 %
Neutro Abs: 12.6 10*3/uL — ABNORMAL HIGH (ref 1.7–7.7)
Neutrophils Relative %: 87 %
Platelets: 164 10*3/uL (ref 150–400)
RBC: 4.69 MIL/uL (ref 4.22–5.81)
RDW: 11.7 % (ref 11.5–15.5)
WBC: 14.5 10*3/uL — ABNORMAL HIGH (ref 4.0–10.5)
nRBC: 0 % (ref 0.0–0.2)

## 2019-02-14 LAB — LIPASE, BLOOD: Lipase: 29 U/L (ref 11–51)

## 2019-02-14 MED ORDER — FENTANYL CITRATE (PF) 100 MCG/2ML IJ SOLN
50.0000 ug | Freq: Once | INTRAMUSCULAR | Status: AC
  Administered 2019-02-14: 50 ug via INTRAVENOUS
  Filled 2019-02-14: qty 2

## 2019-02-14 MED ORDER — KETOROLAC TROMETHAMINE 30 MG/ML IJ SOLN
15.0000 mg | Freq: Once | INTRAMUSCULAR | Status: AC
  Administered 2019-02-14: 15 mg via INTRAVENOUS
  Filled 2019-02-14: qty 1

## 2019-02-14 NOTE — ED Provider Notes (Signed)
[place] DEPT Provider Note   CSN: [id number] Arrival date & time: 02/14/19  1827     History Chief Complaint  Patient presents with  . Dysuria  . Flank Pain    Ryan Meyers is a 79 y.o. male.  Patient is a 79 year old male who presents with right flank pain.  He says that yesterday he had some pain in his right back radiating to his right mid abdomen.  It went away but came back this afternoon and is gotten worse since then.  It is a sharp, achy pain.  He has had some nausea but no vomiting.  No known fevers.  He has had no increase in the pain with urination but he has only been able to urinate a little bit of the time.  He does have a history of kidney stones.  No testicular pain.  He has not taking thing at home for the symptoms.        Past Medical History:  Diagnosis Date  . Cancer [facility])    prostate cancer in 2009    Patient Active Problem List   Diagnosis Date Noted  . Chest pain 08/29/2014  . Hyperlipidemia 08/29/2014  . Thyroid nodule 08/29/2014  . Elevated BP 08/29/2014  . Emphysema of lung ([place]) 08/29/2014    Past Surgical History:  Procedure Laterality Date  . INGUINAL HERNIA REPAIR     right and left       Family History  Problem Relation Age of Onset  . CAD Father 49       Died of MI  . Leukemia Sister   . Hypertension Brother   . CAD Brother 9    Social History   Tobacco Use  . Smoking status: Former Smoker    Types: Cigarettes  . Tobacco comment: Quit 1979  Substance Use Topics  . Alcohol use: No    Alcohol/week: 0.0 standard drinks  . Drug use: No    Home Medications Prior to Admission medications   Medication Sig Start Date End Date Taking? Authorizing Provider  amLODipine (NORVASC) 5 MG tablet Take 1 tablet (5 mg total) by mouth daily. 08/30/14   Rai, Vernelle Emerald, MD  HYDROcodone-acetaminophen (NORCO/VICODIN) 5-325 MG tablet Take 1-2 tablets by mouth every 4 (four) hours as needed. 02/15/19    Malvin Johns, MD  ondansetron (ZOFRAN ODT) 4 MG disintegrating tablet 4mg  ODT q4 hours prn nausea/vomit 02/15/19   Malvin Johns, MD  pantoprazole (PROTONIX) 40 MG tablet Take 1 tablet (40 mg total) by mouth daily. 08/30/14   Rai, Vernelle Emerald, MD  simvastatin (ZOCOR) 20 MG tablet Take 20 mg by mouth at bedtime.    [provider]  tamsulosin (FLOMAX) 0.4 MG CAPS capsule Take 1 capsule (0.4 mg total) by mouth daily. 02/15/19   Malvin Johns, MD    Allergies    Patient has no known allergies.  Review of Systems   Review of Systems  Constitutional: Negative for chills, diaphoresis, fatigue and fever.  HENT: Negative for congestion, rhinorrhea and sneezing.   Eyes: Negative.   Respiratory: Negative for cough, chest tightness and shortness of breath.   Cardiovascular: Negative for chest pain and leg swelling.  Gastrointestinal: Positive for abdominal pain and nausea. Negative for blood in stool, diarrhea and vomiting.  Genitourinary: Positive for difficulty urinating and flank pain. Negative for frequency and hematuria.  Musculoskeletal: Positive for back pain. Negative for arthralgias.  Skin: Negative for rash.  Neurological: Negative for dizziness, speech difficulty,  weakness, numbness and headaches.    Physical Exam Updated Vital Signs BP (!) 145/62   Pulse 68   Temp 98.1 F (36.7 C) (Oral)   Resp 20   SpO2 96%   Physical Exam Constitutional:      Appearance: He is well-developed.  HENT:     Head: Normocephalic and atraumatic.  Eyes:     Pupils: Pupils are equal, round, and reactive to light.  Cardiovascular:     Rate and Rhythm: Normal rate and regular rhythm.     Heart sounds: Normal heart sounds.  Pulmonary:     Effort: Pulmonary effort is normal. No respiratory distress.     Breath sounds: Normal breath sounds. No wheezing or rales.  Chest:     Chest wall: No tenderness.  Abdominal:     General: Bowel sounds are normal.     Palpations: Abdomen is soft.      Tenderness: There is abdominal tenderness (Positive tenderness in the right mid abdomen and flank). There is no guarding or rebound.  Genitourinary:    Comments: Normal external male circumcised genitalia.  No testicular tenderness. Musculoskeletal:        General: Normal range of motion.     Cervical back: Normal range of motion and neck supple.  Lymphadenopathy:     Cervical: No cervical adenopathy.  Skin:    General: Skin is warm and dry.     Findings: No rash.  Neurological:     Mental Status: He is alert and oriented to person, place, and time.     ED Results / Procedures / Treatments   Labs (all labs ordered are listed, but only abnormal results are displayed) Labs Reviewed  URINALYSIS, ROUTINE W REFLEX MICROSCOPIC - Abnormal; Notable for the following components:      Result Value   Glucose, UA 50 (*)    Hgb urine dipstick SMALL (*)    Ketones, ur 20 (*)    Protein, ur 30 (*)    All other components within normal limits  COMPREHENSIVE METABOLIC PANEL - Abnormal; Notable for the following components:   Glucose, Bld 157 (*)    BUN 27 (*)    GFR calc non Af Amer 60 (*)    All other components within normal limits  CBC WITH DIFFERENTIAL/PLATELET - Abnormal; Notable for the following components:   WBC 14.5 (*)    Neutro Abs 12.6 (*)    Abs Immature Granulocytes 0.09 (*)    All other components within normal limits  LIPASE, BLOOD    EKG None  Radiology CT Renal Stone Study  Result Date: 02/14/2019 CLINICAL DATA:  Initial evaluation for acute flank pain, stone disease suspected. EXAM: CT ABDOMEN AND PELVIS WITHOUT CONTRAST TECHNIQUE: Multidetector CT imaging of the abdomen and pelvis was performed following the standard protocol without IV contrast. COMPARISON:  None available. FINDINGS: Lower chest: Mild scattered subsegmental atelectatic changes noted at the right lung base. Few subcentimeter radiopaque densities noted at the peripheral right lower lobe 1,  indeterminate, but of doubtful clinical significance. Hepatobiliary: Liver demonstrates a normal unenhanced appearance. Gallbladder within normal limits. No biliary dilatation. Pancreas: Pancreas within normal limits. Spleen: Spleen within normal limits. Adrenals/Urinary Tract: Adrenal glands are normal. Left kidney within normal limits without nephrolithiasis or hydronephrosis. No radiopaque calculi seen along the course of the left renal collecting system. No left-sided hydroureter. On the right, there is an obstructive 6 mm stone positioned at the right UVJ w with secondary moderate right hydroureteronephrosis. Associated perinephric  and periureteral fat stranding. No other radiopaque calculi seen within the right kidney along the course of the right renal collecting system. 1.4 cm simple cyst noted at the lower pole of the right kidney. Partially distended bladder within normal limits. No other layering stones within the bladder lumen. Stomach/Bowel: Stomach within normal limits. No evidence for bowel obstruction. Normal appendix. No acute inflammatory changes seen about the bowels. Vascular/Lymphatic: Moderate aorto bi-iliac atherosclerotic disease. Aneurysmal dilatation of the intraabdominal aorta up to 3.0 cm in diameter. Reproductive: Brachytherapy seeds overlie the prostate. Other: No free air or fluid. Musculoskeletal: No acute osseous abnormality. No discrete lytic or blastic osseous lesions. 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 IMPRESSION: 1. 6 mm obstructive stone at the right UVJ with secondary moderate right hydroureteronephrosis. 2. No other acute intra-abdominal or pelvic process. 3. Aortic aneurysm measuring up to 3 cm in diameter (ICD10-I71.9). Recommend followup by ultrasound in 3 years. This recommendation follows ACR consensus guidelines: White Paper of the ACR Incidental Findings  Committee II on Vascular Findings. J Am Coll Radiol 2013; [id number] 4. 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 Electronically Signed   By: Jeannine Boga M.D.   On: 02/14/2019 22:30    Procedures Procedures (including critical care time)  Medications Ordered in ED Medications  fentaNYL (SUBLIMAZE) injection 50 mcg (50 mcg Intravenous Given 02/14/19 2147)  fentaNYL (SUBLIMAZE) injection 50 mcg (50 mcg Intravenous Given 02/14/19 2323)  ketorolac (TORADOL) 30 MG/ML injection 15 mg (15 mg Intravenous Given 02/14/19 2323)    ED Course  I have reviewed the triage vital signs and the nursing notes.  Pertinent labs & imaging results that were available during my care of the patient were reviewed by me and considered in my medical decision making (see chart for details).    MDM Rules/Calculators/A&P                      Patient presents with right flank pain.  On CT has a 6 mm UVJ stone.  His urine does not appear to be infected.  His creatinine is okay.  His pain is controlled in the ED after treatment.  He does have a AAA incidentally found on CT.  I discussed this finding with the patient and that he will need to follow-up with his PCP for periodic follow-up.  He was given a prescription for hydrocodone, Zofran and Flomax.  Return precautions were given. Final Clinical Impression(s) / ED Diagnoses Final diagnoses:  Kidney stone  Abdominal aortic aneurysm (AAA) without rupture ([place])    Rx / DC Orders ED Discharge Orders         Ordered    HYDROcodone-acetaminophen (NORCO/VICODIN) 5-325 MG tablet  Every 4 hours PRN     02/15/19 0024    ondansetron (ZOFRAN ODT) 4 MG disintegrating tablet     02/15/19 0024    tamsulosin (FLOMAX) 0.4 MG CAPS capsule  Daily     02/15/19 0024           Malvin Johns, MD 02/15/19 [phone]

## 2019-02-14 NOTE — ED Notes (Signed)
Pt unable to provide urine sample at this time.  Bed rail was put down so pt can stand and pt is going to try to urinate again.

## 2019-02-14 NOTE — ED Triage Notes (Signed)
Per pt, states he has been having right flank pain since this afternoon-states dysuria as well-history of prostate cancer

## 2019-02-15 LAB — URINALYSIS, ROUTINE W REFLEX MICROSCOPIC
Bacteria, UA: NONE SEEN
Bilirubin Urine: NEGATIVE
Glucose, UA: 50 mg/dL — AB
Ketones, ur: 20 mg/dL — AB
Leukocytes,Ua: NEGATIVE
Nitrite: NEGATIVE
Protein, ur: 30 mg/dL — AB
Specific Gravity, Urine: 1.018 (ref 1.005–1.030)
pH: 6 (ref 5.0–8.0)

## 2019-02-15 MED ORDER — TAMSULOSIN HCL 0.4 MG PO CAPS: 0.4000 mg | ORAL_CAPSULE | Freq: Every day | ORAL | 0 refills | Status: AC

## 2019-02-15 MED ORDER — HYDROCODONE-ACETAMINOPHEN 5-325 MG PO TABS: 1.0000 | ORAL_TABLET | ORAL | 0 refills | Status: AC | PRN

## 2019-02-15 MED ORDER — ONDANSETRON 4 MG PO TBDP: ORAL_TABLET | ORAL | 0 refills | Status: AC

## 2019-02-15 NOTE — Discharge Instructions (Signed)
Follow-up with Dr. Karsten Ro regarding the kidney stone.  If you are taking the narcotic pain medication, you need to take a stool softener along with it.  Do not take any additional Tylenol.  Return to emergency room if you have any worsening pain, vomiting, fevers or trouble urinating.  He did have a 3 cm aortic aneurysm that will need periodic follow-up.  Discussed this with your primary care provider.

## 2019-03-17 ENCOUNTER — Ambulatory Visit: Payer: PRIVATE HEALTH INSURANCE

## 2019-03-21 ENCOUNTER — Ambulatory Visit: Payer: Medicare Other | Attending: Internal Medicine

## 2019-03-21 DIAGNOSIS — Z23 Encounter for immunization: Secondary | ICD-10-CM

## 2019-03-21 NOTE — Progress Notes (Signed)
   Covid-19 Vaccination Clinic  Name:  RHONIN LAURIANO    MRN: [id number] DOB: [birth year]  03/21/2019  Mr. Pha was observed post Covid-19 immunization for 15 minutes without incidence. He was provided with Vaccine Information Sheet and instruction to access the V-Safe system.   Mr. Butterly was instructed to call 911 with any severe reactions post vaccine: Marland Kitchen Difficulty breathing  . Swelling of your face and throat  . A fast heartbeat  . A bad rash all over your body  . Dizziness and weakness    Immunizations Administered    Name Date Dose VIS Date Route   Pfizer COVID-19 Vaccine 03/21/2019 11:38 AM 0.3 mL 01/24/2019 Intramuscular   Manufacturer: [place]   Lot: [id number]   [place]: [id number]

## 2019-04-15 ENCOUNTER — Ambulatory Visit: Payer: Medicare Other | Attending: Internal Medicine

## 2019-04-15 DIAGNOSIS — Z23 Encounter for immunization: Secondary | ICD-10-CM | POA: Insufficient documentation

## 2019-04-15 NOTE — Progress Notes (Signed)
   Covid-19 Vaccination Clinic  Name:  OPIE OPALINSKI    MRN: [id number] DOB: March 02, 1940  04/15/2019  Mr. Bastarache was observed post Covid-19 immunization for 15 minutes without incident. He was provided with Vaccine Information Sheet and instruction to access the V-Safe system.   Mr. Soni was instructed to call 911 with any severe reactions post vaccine: Marland Kitchen Difficulty breathing  . Swelling of face and throat  . A fast heartbeat  . A bad rash all over body  . Dizziness and weakness   Immunizations Administered    Name Date Dose VIS Date Route   Pfizer COVID-19 Vaccine 04/15/2019 12:03 PM 0.3 mL 01/24/2019 Intramuscular   Manufacturer: [place]   Lot: [id number]   [place]: [id number]

## 2019-05-09 DIAGNOSIS — H25099 Other age-related incipient cataract, unspecified eye: Secondary | ICD-10-CM | POA: Diagnosis not present

## 2019-05-09 DIAGNOSIS — H52222 Regular astigmatism, left eye: Secondary | ICD-10-CM | POA: Diagnosis not present

## 2019-05-09 DIAGNOSIS — H18419 Arcus senilis, unspecified eye: Secondary | ICD-10-CM | POA: Diagnosis not present

## 2019-05-09 DIAGNOSIS — H5203 Hypermetropia, bilateral: Secondary | ICD-10-CM | POA: Diagnosis not present

## 2019-05-09 DIAGNOSIS — H524 Presbyopia: Secondary | ICD-10-CM | POA: Diagnosis not present

## 2019-05-09 DIAGNOSIS — H43391 Other vitreous opacities, right eye: Secondary | ICD-10-CM | POA: Diagnosis not present

## 2019-06-11 DIAGNOSIS — K4090 Unilateral inguinal hernia, without obstruction or gangrene, not specified as recurrent: Secondary | ICD-10-CM | POA: Diagnosis not present

## 2019-11-26 DIAGNOSIS — Z23 Encounter for immunization: Secondary | ICD-10-CM | POA: Diagnosis not present

## 2019-12-01 DIAGNOSIS — Z23 Encounter for immunization: Secondary | ICD-10-CM | POA: Diagnosis not present

## 2019-12-04 DIAGNOSIS — H900 Conductive hearing loss, bilateral: Secondary | ICD-10-CM | POA: Diagnosis not present

## 2020-02-25 IMAGING — CT CT RENAL STONE PROTOCOL
2 of 4 series · 15 of 46 positions shown, 17 images · non-contrast
Comparison: None available.

CLINICAL DATA: Initial evaluation for acute flank pain, stone
disease suspected.

EXAM:
CT ABDOMEN AND PELVIS WITHOUT CONTRAST
TECHNIQUE: Multidetector CT imaging of the abdomen and pelvis was performed
following the standard protocol without IV contrast.

[Series 2: axial st · axial · 0.65mm/px · z∈[-380,-30]mm · 12 of 81 slices shown, 14 images]
[im 6/81  soft-tissue]
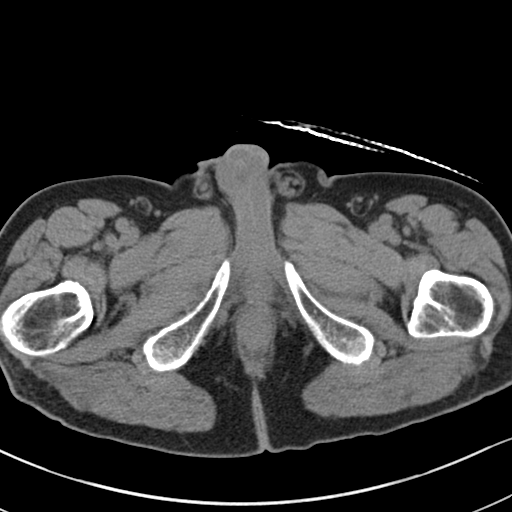
[im 6/81  bone]
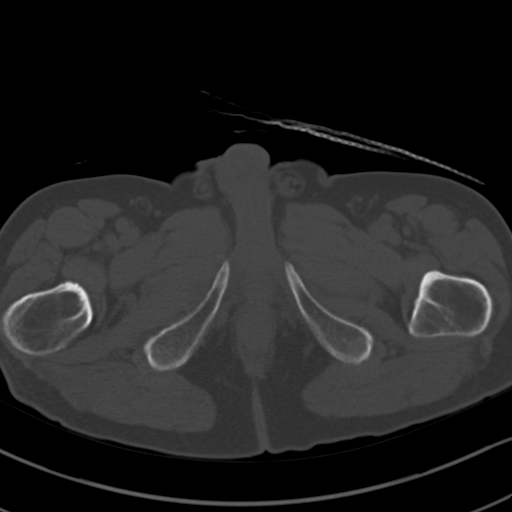
[im 11/81  soft-tissue]
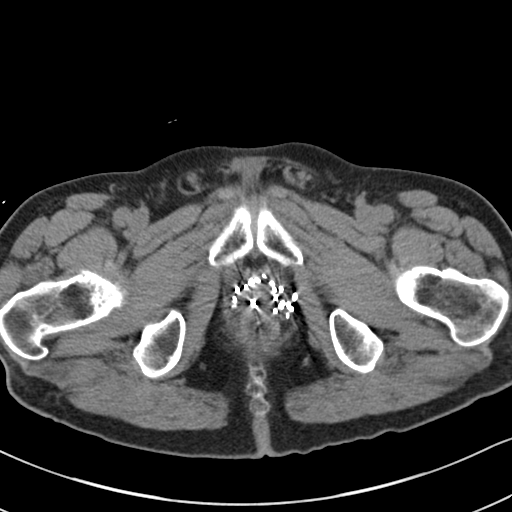
[im 21/81  soft-tissue]
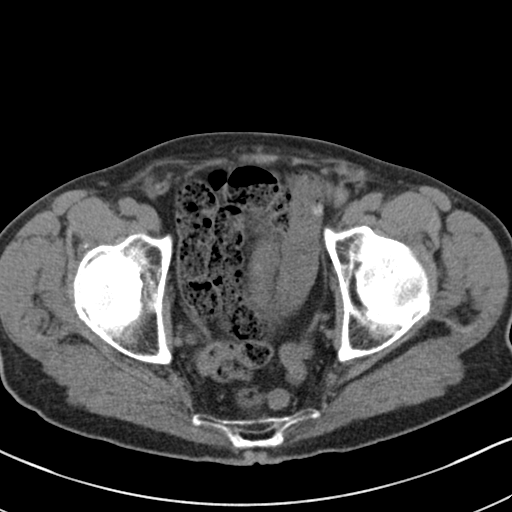
[im 26/81  soft-tissue]
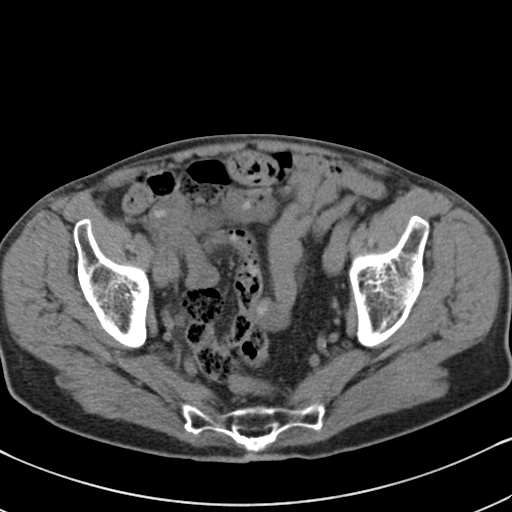
[im 31/81  soft-tissue]
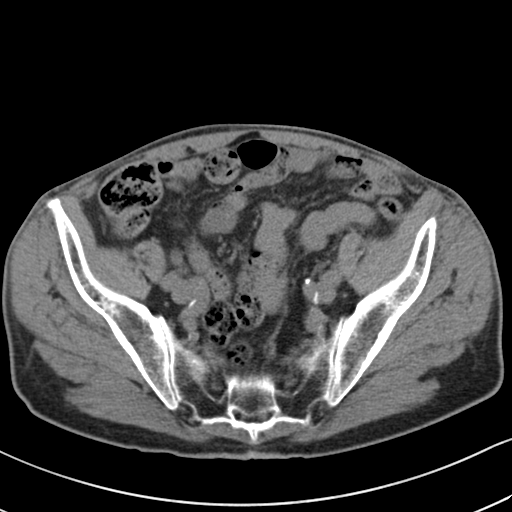
[im 36/81  soft-tissue]
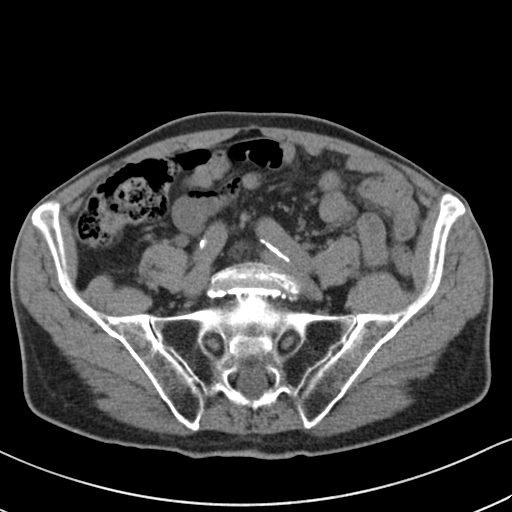
[im 46/81  soft-tissue]
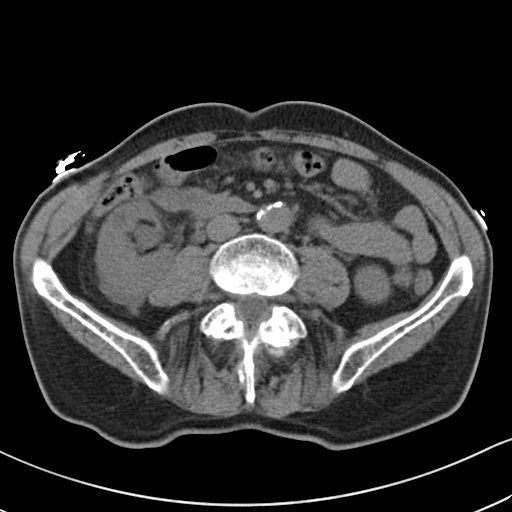
[im 51/81  soft-tissue]
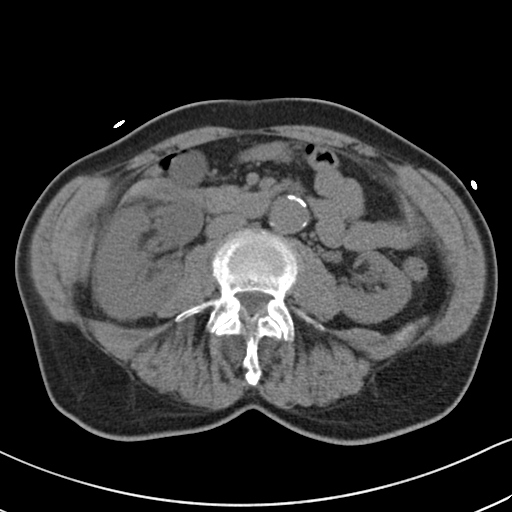
[im 56/81  soft-tissue]
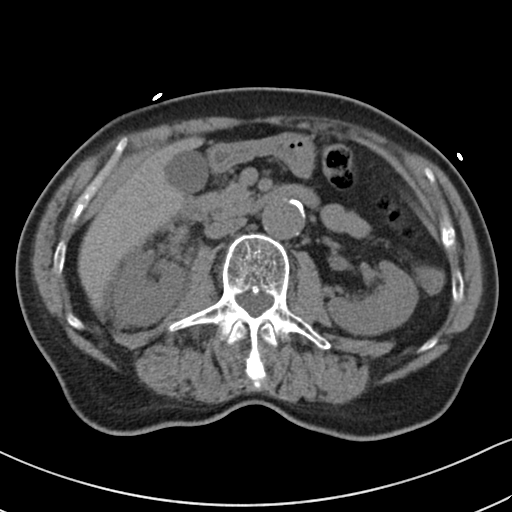
[im 56/81  bone]
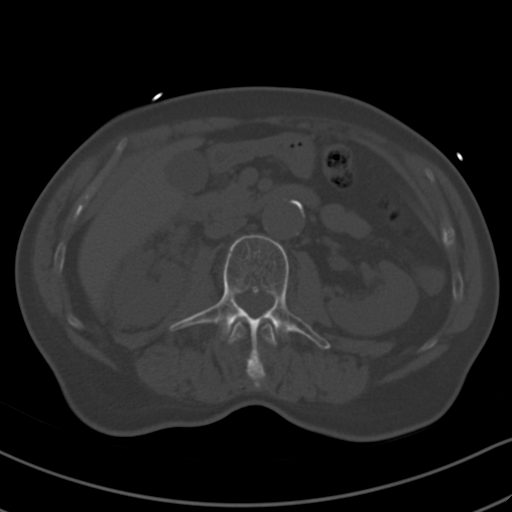
[im 61/81  soft-tissue]
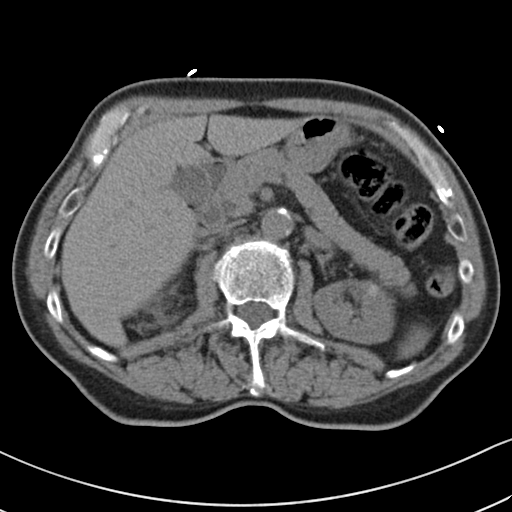
[im 71/81  soft-tissue]
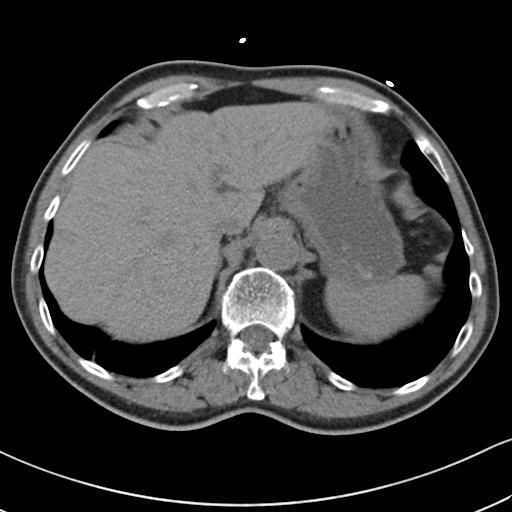
[im 76/81  soft-tissue]
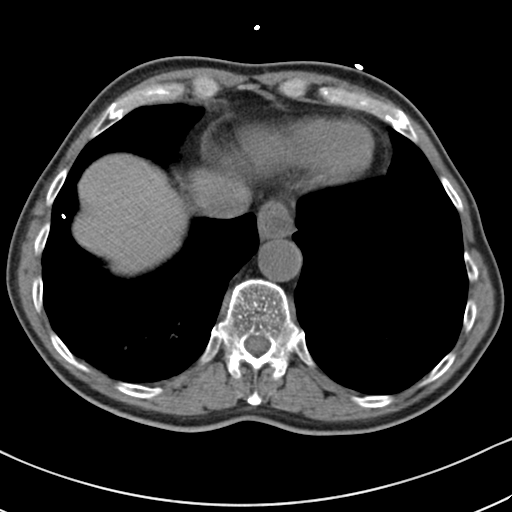

[Series 5: coronal · coronal · 0.64mm/px · 3 of 115 slices shown]
[im 39/115  soft-tissue]
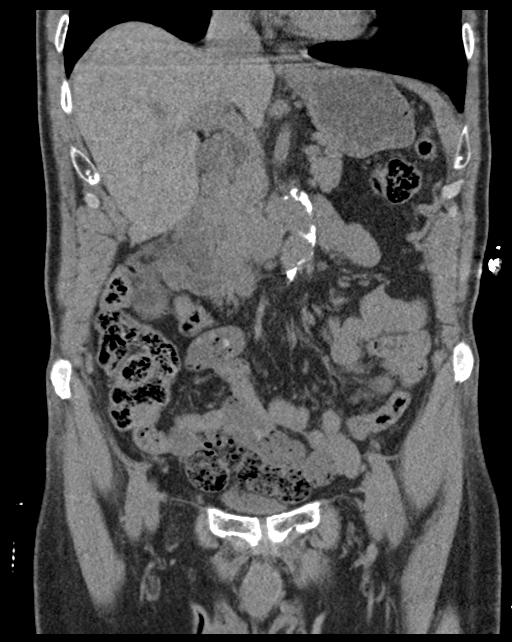
[im 51/115  soft-tissue]
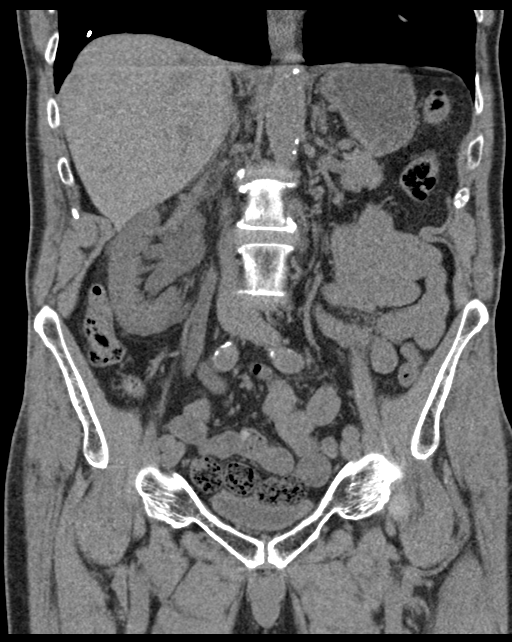
[im 64/115  soft-tissue]
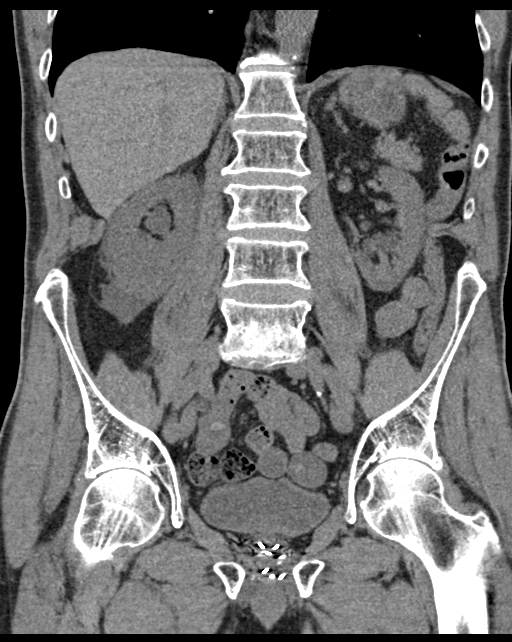

[15 of 46 positions shown; findings below may reference images not displayed]

FINDINGS: Lower chest: Mild scattered subsegmental atelectatic changes noted
at the right lung base. Few subcentimeter radiopaque densities noted
at the peripheral right lower lobe 1, indeterminate, but of doubtful
clinical significance.

Hepatobiliary: Liver demonstrates a normal unenhanced appearance.
Gallbladder within normal limits. No biliary dilatation.

Pancreas: Pancreas within normal limits.

Spleen: Spleen within normal limits.

Adrenals/Urinary Tract: Adrenal glands are normal.

Left kidney within normal limits without nephrolithiasis or
hydronephrosis. No radiopaque calculi seen along the course of the
left renal collecting system. No left-sided hydroureter.

On the right, there is an obstructive 6 mm stone positioned at the
right UVJ w with secondary moderate right hydroureteronephrosis.
Associated perinephric and periureteral fat stranding. No other
radiopaque calculi seen within the right kidney along the course of
the right renal collecting system. 1.4 cm simple cyst noted at the
lower pole of the right kidney.

Partially distended bladder within normal limits. No other layering
stones within the bladder lumen.

Stomach/Bowel: Stomach within normal limits. No evidence for bowel
obstruction. Normal appendix. No acute inflammatory changes seen
about the bowels.

Vascular/Lymphatic: Moderate aorto bi-iliac atherosclerotic disease.
Aneurysmal dilatation of the intraabdominal aorta up to 3.0 cm in
diameter.

Reproductive: Brachytherapy seeds overlie the prostate.

Other: No free air or fluid.

Musculoskeletal: No acute osseous abnormality. No discrete lytic or
blastic osseous lesions. Chronic bilateral pars defects at L5 with
associated 12 mm spondylolisthesis of L5 on S1. Osteoarthritic
changes noted about the hips bilaterally.
IMPRESSION: 1. 6 mm obstructive stone at the right UVJ with secondary moderate
right hydroureteronephrosis.
2. No other acute intra-abdominal or pelvic process.
3. Aortic aneurysm measuring up to 3 cm in diameter ([id number]).
Recommend followup by ultrasound in 3 years. This recommendation
follows ACR consensus guidelines: White Paper of the ACR Incidental
Findings Committee II on Vascular Findings. [HOSPITAL] 5657;
4. Chronic bilateral pars defects at L5 with associated 12 mm
spondylolisthesis of L5 on S1.

## 2020-08-17 DIAGNOSIS — Z0000 Encounter for general adult medical examination without abnormal findings: Secondary | ICD-10-CM | POA: Diagnosis not present

## 2020-08-17 DIAGNOSIS — Z23 Encounter for immunization: Secondary | ICD-10-CM | POA: Diagnosis not present

## 2020-08-17 DIAGNOSIS — K4090 Unilateral inguinal hernia, without obstruction or gangrene, not specified as recurrent: Secondary | ICD-10-CM | POA: Diagnosis not present

## 2020-08-17 DIAGNOSIS — E7800 Pure hypercholesterolemia, unspecified: Secondary | ICD-10-CM | POA: Diagnosis not present

## 2020-08-17 DIAGNOSIS — Z8546 Personal history of malignant neoplasm of prostate: Secondary | ICD-10-CM | POA: Diagnosis not present

## 2020-08-17 DIAGNOSIS — L723 Sebaceous cyst: Secondary | ICD-10-CM | POA: Diagnosis not present

## 2020-11-24 DIAGNOSIS — H5203 Hypermetropia, bilateral: Secondary | ICD-10-CM | POA: Diagnosis not present

## 2020-11-24 DIAGNOSIS — H52223 Regular astigmatism, bilateral: Secondary | ICD-10-CM | POA: Diagnosis not present

## 2020-11-24 DIAGNOSIS — H524 Presbyopia: Secondary | ICD-10-CM | POA: Diagnosis not present

## 2020-11-24 DIAGNOSIS — H2513 Age-related nuclear cataract, bilateral: Secondary | ICD-10-CM | POA: Diagnosis not present

## 2020-12-15 DIAGNOSIS — Z23 Encounter for immunization: Secondary | ICD-10-CM | POA: Diagnosis not present

## 2021-01-25 DIAGNOSIS — Z23 Encounter for immunization: Secondary | ICD-10-CM | POA: Diagnosis not present

## 2021-03-30 DIAGNOSIS — K4091 Unilateral inguinal hernia, without obstruction or gangrene, recurrent: Secondary | ICD-10-CM | POA: Diagnosis not present

## 2021-06-06 DIAGNOSIS — K4091 Unilateral inguinal hernia, without obstruction or gangrene, recurrent: Secondary | ICD-10-CM | POA: Diagnosis not present

## 2021-09-05 DIAGNOSIS — Z0000 Encounter for general adult medical examination without abnormal findings: Secondary | ICD-10-CM | POA: Diagnosis not present

## 2021-09-05 DIAGNOSIS — Z8546 Personal history of malignant neoplasm of prostate: Secondary | ICD-10-CM | POA: Diagnosis not present

## 2021-09-05 DIAGNOSIS — E7800 Pure hypercholesterolemia, unspecified: Secondary | ICD-10-CM | POA: Diagnosis not present

## 2021-09-26 DIAGNOSIS — Z09 Encounter for follow-up examination after completed treatment for conditions other than malignant neoplasm: Secondary | ICD-10-CM | POA: Diagnosis not present

## 2021-09-26 DIAGNOSIS — Z9889 Other specified postprocedural states: Secondary | ICD-10-CM | POA: Diagnosis not present

## 2021-09-26 DIAGNOSIS — K648 Other hemorrhoids: Secondary | ICD-10-CM | POA: Diagnosis not present

## 2021-09-26 DIAGNOSIS — D124 Benign neoplasm of descending colon: Secondary | ICD-10-CM | POA: Diagnosis not present

## 2021-09-26 DIAGNOSIS — Z86010 Personal history of colonic polyps: Secondary | ICD-10-CM | POA: Diagnosis not present

## 2021-09-29 DIAGNOSIS — D124 Benign neoplasm of descending colon: Secondary | ICD-10-CM | POA: Diagnosis not present

## 2021-11-12 DIAGNOSIS — Z23 Encounter for immunization: Secondary | ICD-10-CM | POA: Diagnosis not present

## 2022-04-25 DIAGNOSIS — H2513 Age-related nuclear cataract, bilateral: Secondary | ICD-10-CM | POA: Diagnosis not present

## 2022-04-25 DIAGNOSIS — H5203 Hypermetropia, bilateral: Secondary | ICD-10-CM | POA: Diagnosis not present

## 2022-04-25 DIAGNOSIS — Z135 Encounter for screening for eye and ear disorders: Secondary | ICD-10-CM | POA: Diagnosis not present

## 2022-04-25 DIAGNOSIS — H524 Presbyopia: Secondary | ICD-10-CM | POA: Diagnosis not present

## 2022-04-25 DIAGNOSIS — H52223 Regular astigmatism, bilateral: Secondary | ICD-10-CM | POA: Diagnosis not present

## 2022-07-25 DIAGNOSIS — H2513 Age-related nuclear cataract, bilateral: Secondary | ICD-10-CM | POA: Diagnosis not present

## 2022-07-25 DIAGNOSIS — H25043 Posterior subcapsular polar age-related cataract, bilateral: Secondary | ICD-10-CM | POA: Diagnosis not present

## 2022-07-25 DIAGNOSIS — H25013 Cortical age-related cataract, bilateral: Secondary | ICD-10-CM | POA: Diagnosis not present

## 2022-07-25 DIAGNOSIS — H2512 Age-related nuclear cataract, left eye: Secondary | ICD-10-CM | POA: Diagnosis not present

## 2022-07-25 DIAGNOSIS — H18413 Arcus senilis, bilateral: Secondary | ICD-10-CM | POA: Diagnosis not present

## 2022-09-14 DIAGNOSIS — Z86010 Personal history of colonic polyps: Secondary | ICD-10-CM | POA: Diagnosis not present

## 2022-09-14 DIAGNOSIS — Z0000 Encounter for general adult medical examination without abnormal findings: Secondary | ICD-10-CM | POA: Diagnosis not present

## 2022-09-14 DIAGNOSIS — E7800 Pure hypercholesterolemia, unspecified: Secondary | ICD-10-CM | POA: Diagnosis not present

## 2022-09-14 DIAGNOSIS — R222 Localized swelling, mass and lump, trunk: Secondary | ICD-10-CM | POA: Diagnosis not present

## 2022-09-14 DIAGNOSIS — Z8546 Personal history of malignant neoplasm of prostate: Secondary | ICD-10-CM | POA: Diagnosis not present

## 2022-09-15 ENCOUNTER — Other Ambulatory Visit: Payer: Self-pay | Admitting: Family Medicine

## 2022-09-15 DIAGNOSIS — R222 Localized swelling, mass and lump, trunk: Secondary | ICD-10-CM

## 2022-09-20 ENCOUNTER — Other Ambulatory Visit: Payer: PRIVATE HEALTH INSURANCE

## 2022-09-20 DIAGNOSIS — R222 Localized swelling, mass and lump, trunk: Secondary | ICD-10-CM

## 2022-09-20 DIAGNOSIS — L723 Sebaceous cyst: Secondary | ICD-10-CM | POA: Diagnosis not present

## 2022-09-29 DIAGNOSIS — H2511 Age-related nuclear cataract, right eye: Secondary | ICD-10-CM | POA: Diagnosis not present

## 2022-09-29 DIAGNOSIS — H2512 Age-related nuclear cataract, left eye: Secondary | ICD-10-CM | POA: Diagnosis not present

## 2022-09-29 DIAGNOSIS — H25812 Combined forms of age-related cataract, left eye: Secondary | ICD-10-CM | POA: Diagnosis not present

## 2022-10-13 DIAGNOSIS — H2511 Age-related nuclear cataract, right eye: Secondary | ICD-10-CM | POA: Diagnosis not present

## 2022-10-13 DIAGNOSIS — H25041 Posterior subcapsular polar age-related cataract, right eye: Secondary | ICD-10-CM | POA: Diagnosis not present

## 2022-10-13 DIAGNOSIS — H25811 Combined forms of age-related cataract, right eye: Secondary | ICD-10-CM | POA: Diagnosis not present

## 2022-10-13 DIAGNOSIS — H25011 Cortical age-related cataract, right eye: Secondary | ICD-10-CM | POA: Diagnosis not present

## 2022-11-16 DIAGNOSIS — Z23 Encounter for immunization: Secondary | ICD-10-CM | POA: Diagnosis not present

## 2022-12-12 DIAGNOSIS — Z23 Encounter for immunization: Secondary | ICD-10-CM | POA: Diagnosis not present

## 2023-03-03 ENCOUNTER — Emergency Department (HOSPITAL_BASED_OUTPATIENT_CLINIC_OR_DEPARTMENT_OTHER)
Admission: EM | Admit: 2023-03-03 | Discharge: 2023-03-03 | Disposition: A | Discharge status: Home / Self Care | Payer: Medicare Other | Attending: Emergency Medicine | Admitting: Emergency Medicine

## 2023-03-03 ENCOUNTER — Encounter (HOSPITAL_BASED_OUTPATIENT_CLINIC_OR_DEPARTMENT_OTHER): Payer: Self-pay

## 2023-03-03 DIAGNOSIS — Z79899 Other long term (current) drug therapy: Secondary | ICD-10-CM | POA: Insufficient documentation

## 2023-03-03 DIAGNOSIS — I10 Essential (primary) hypertension: Secondary | ICD-10-CM | POA: Insufficient documentation

## 2023-03-03 LAB — BASIC METABOLIC PANEL
Anion gap: 6 (ref 5–15)
BUN: 19 mg/dL (ref 8–23)
CO2: 28 mmol/L (ref 22–32)
Calcium: 9.2 mg/dL (ref 8.9–10.3)
Chloride: 105 mmol/L (ref 98–111)
Creatinine, Ser: 0.89 mg/dL (ref 0.61–1.24)
GFR, Estimated: >60 mL/min (ref >60)
Glucose, Bld: 107 mg/dL — ABNORMAL HIGH (ref 70–99)
Potassium: 4.6 mmol/L (ref 3.5–5.1)
Sodium: 139 mmol/L (ref 135–145)

## 2023-03-03 LAB — CBC
HCT: 43.2 % (ref 39.0–52.0)
Hemoglobin: 14.9 g/dL (ref 13.0–17.0)
MCH: 30.6 pg (ref 26.0–34.0)
MCHC: 34.5 g/dL (ref 30.0–36.0)
MCV: 88.7 fL (ref 80.0–100.0)
Platelets: 158 10*3/uL (ref 150–400)
RBC: 4.87 MIL/uL (ref 4.22–5.81)
RDW: 11.9 % (ref 11.5–15.5)
WBC: 6.7 10*3/uL (ref 4.0–10.5)
nRBC: 0 % (ref 0.0–0.2)

## 2023-03-03 MED ORDER — AMLODIPINE BESYLATE 5 MG PO TABS
5.0000 mg | ORAL_TABLET | Freq: Once | ORAL | Status: AC
  Administered 2023-03-03: 5 mg via ORAL

## 2023-03-03 MED ORDER — AMLODIPINE BESYLATE 5 MG PO TABS
10.0000 mg | ORAL_TABLET | Freq: Once | ORAL | Status: DC
  Filled 2023-03-03: qty 2

## 2023-03-03 NOTE — Discharge Instructions (Addendum)
Call your doctor on Monday for recheck You were given 1 dose of amlodipine here in the ED. Your CBC and chemistries and EKG do not show any acute abnormalities here in the ED.  Return if you are having new or worsening symptoms especially chest pain or shortness of breath.

## 2023-03-03 NOTE — ED Triage Notes (Signed)
He tells me that he has "never been on blood pressure medicine". He, for a long time now has periodically checked his b/p at home. He is here today with c/o "I've been getting higher numbers lately--my top number has been in the 150s". He denies any pain or discomfort and he denies any sign of current illness.

## 2023-03-03 NOTE — ED Provider Notes (Signed)
EMERGENCY DEPARTMENT AT [facility] Provider Note   CSN: [id number] Arrival date & time: 03/03/23  0272     History  Chief Complaint  Patient presents with   Hypertension    Ryan Meyers is a 83 y.o. male.  HPI -year-old male presents today complaining of high blood pressure.  He states it is not and she has not had high blood pressure in the past.  He is seeing a prescription for Norvasc 5 mg 08/30/2014.  He does not remember being on this or having high blood pressure.  He states that he sometimes takes his blood pressure when his wife takes his blood pressure and his normal blood pressures systolically 140.  He has noticed a creeping up this week and has been taking in the morning when she takes hers.  He states that it was up to over 160 today and he thought he should come in to have this checked.  He denies any chest pain, headache, weakness on one side or the other, dyspnea, or any other new symptoms.    Home Medications Prior to Admission medications   Medication Sig Start Date End Date Taking? Authorizing Provider  amLODipine (NORVASC) 5 MG tablet Take 1 tablet (5 mg total) by mouth daily. 08/30/14   Rai, Delene Ruffini, MD  HYDROcodone-acetaminophen (NORCO/VICODIN) 5-325 MG tablet Take 1-2 tablets by mouth every 4 (four) hours as needed. 02/15/19   Rolan Bucco, MD  ondansetron (ZOFRAN ODT) 4 MG disintegrating tablet 4mg  ODT q4 hours prn nausea/vomit 02/15/19   Rolan Bucco, MD  pantoprazole (PROTONIX) 40 MG tablet Take 1 tablet (40 mg total) by mouth daily. 08/30/14   Rai, Delene Ruffini, MD  simvastatin (ZOCOR) 20 MG tablet Take 20 mg by mouth at bedtime.    [provider]  tamsulosin (FLOMAX) 0.4 MG CAPS capsule Take 1 capsule (0.4 mg total) by mouth daily. 02/15/19   Rolan Bucco, MD      Allergies    Patient has no known allergies.    Review of Systems   Review of Systems  Physical Exam Updated Vital Signs BP (!) 161/78   Pulse 72   Temp  97.8 F (36.6 C) (Oral)   Resp 18   SpO2 100%  Physical Exam Vitals and nursing note reviewed.  Constitutional:      Appearance: He is well-developed.  HENT:     Head: Normocephalic and atraumatic.     Right Ear: External ear normal.     Left Ear: External ear normal.     Nose: Nose normal.  Eyes:     Extraocular Movements: Extraocular movements intact.  Neck:     Trachea: No tracheal deviation.  Cardiovascular:     Rate and Rhythm: Normal rate and regular rhythm.  Pulmonary:     Effort: Pulmonary effort is normal.  Musculoskeletal:        General: Normal range of motion.     Cervical back: Normal range of motion and neck supple.  Skin:    General: Skin is warm and dry.     Capillary Refill: Capillary refill takes less than 2 seconds.  Neurological:     General: No focal deficit present.     Mental Status: He is alert and oriented to person, place, and time.  Psychiatric:        Mood and Affect: Mood normal.        Behavior: Behavior normal.     ED Results / Procedures / Treatments  Labs (all labs ordered are listed, but only abnormal results are displayed) Labs Reviewed  BASIC METABOLIC PANEL - Abnormal; Notable for the following components:      Result Value   Glucose, Bld 107 (*)    All other components within normal limits  CBC    EKG EKG Interpretation Date/Time:  Saturday March 03 2023 11:47:00 EST Ventricular Rate:  68 PR Interval:  167 QRS Duration:  97 QT Interval:  397 QTC Calculation: 423 R Axis:   -53  Text Interpretation: Sinus rhythm Left anterior fascicular block Confirmed by Margarita Grizzle [phone]) on 03/03/2023 12:18:30 PM  Radiology No results found.  Procedures Procedures    Medications Ordered in ED Medications  amLODipine (NORVASC) tablet 5 mg (5 mg Oral Given 03/03/23 1130)    ED Course/ Medical Decision Making/ A&P Clinical Course as of 03/03/23 1223  Sat Mar 03, 2023  1220 CBC reviewed interpreted normal limits basic  metabolic panel reviewed interpreted within normal limits [DR]    Clinical Course User Index [DR] Margarita Grizzle, MD                                 Medical Decision Making Amount and/or Complexity of Data Reviewed Labs: ordered.  Risk Prescription drug management.   83 year old male presents today complaining of concern for high blood pressure.  Here in the ED blood pressures have been in the 160s systolically with last blood pressure before medication 161/78.  He is evaluated with CBC, BMP, EKG.  No evidence of acute abnormalities noted Patient was given dose of amlodipine here in ED.  Patient advised regarding return precautions and need for follow-up advised follow-up next week with his primary care physician        Final Clinical Impression(s) / ED Diagnoses Final diagnoses:  Hypertension, unspecified type    Rx / DC Orders ED Discharge Orders     None         Margarita Grizzle, MD 03/03/23 1223

## 2023-03-05 DIAGNOSIS — I10 Essential (primary) hypertension: Secondary | ICD-10-CM | POA: Diagnosis not present

## 2023-03-08 ENCOUNTER — Emergency Department (HOSPITAL_COMMUNITY): Payer: Medicare Other

## 2023-03-08 ENCOUNTER — Other Ambulatory Visit: Payer: Self-pay

## 2023-03-08 ENCOUNTER — Encounter (HOSPITAL_COMMUNITY): Payer: Self-pay | Admitting: *Deleted

## 2023-03-08 ENCOUNTER — Emergency Department (HOSPITAL_COMMUNITY)
Admission: EM | Admit: 2023-03-08 | Discharge: 2023-03-08 | Disposition: A | Discharge status: Home / Self Care | Payer: Medicare Other | Attending: Emergency Medicine | Admitting: Emergency Medicine

## 2023-03-08 DIAGNOSIS — Z8546 Personal history of malignant neoplasm of prostate: Secondary | ICD-10-CM | POA: Diagnosis not present

## 2023-03-08 DIAGNOSIS — R109 Unspecified abdominal pain: Secondary | ICD-10-CM | POA: Diagnosis not present

## 2023-03-08 DIAGNOSIS — R0789 Other chest pain: Secondary | ICD-10-CM

## 2023-03-08 DIAGNOSIS — I771 Stricture of artery: Secondary | ICD-10-CM | POA: Diagnosis not present

## 2023-03-08 DIAGNOSIS — I10 Essential (primary) hypertension: Secondary | ICD-10-CM | POA: Insufficient documentation

## 2023-03-08 DIAGNOSIS — Z79899 Other long term (current) drug therapy: Secondary | ICD-10-CM | POA: Insufficient documentation

## 2023-03-08 DIAGNOSIS — J439 Emphysema, unspecified: Secondary | ICD-10-CM | POA: Diagnosis not present

## 2023-03-08 DIAGNOSIS — R079 Chest pain, unspecified: Secondary | ICD-10-CM | POA: Diagnosis not present

## 2023-03-08 DIAGNOSIS — M549 Dorsalgia, unspecified: Secondary | ICD-10-CM | POA: Diagnosis not present

## 2023-03-08 LAB — COMPREHENSIVE METABOLIC PANEL
ALT: 22 U/L (ref 0–44)
AST: 22 U/L (ref 15–41)
Albumin: 4 g/dL (ref 3.5–5.0)
Alkaline Phosphatase: 107 U/L (ref 38–126)
Anion gap: 13 (ref 5–15)
BUN: 23 mg/dL (ref 8–23)
CO2: 24 mmol/L (ref 22–32)
Calcium: 9.3 mg/dL (ref 8.9–10.3)
Chloride: 103 mmol/L (ref 98–111)
Creatinine, Ser: 1.07 mg/dL (ref 0.61–1.24)
GFR, Estimated: >60 mL/min (ref >60)
Glucose, Bld: 122 mg/dL — ABNORMAL HIGH (ref 70–99)
Potassium: 3.7 mmol/L (ref 3.5–5.1)
Sodium: 140 mmol/L (ref 135–145)
Total Bilirubin: 1.2 mg/dL (ref 0.0–1.2)
Total Protein: 6.8 g/dL (ref 6.5–8.1)

## 2023-03-08 LAB — CBC
HCT: 44.9 % (ref 39.0–52.0)
Hemoglobin: 15.4 g/dL (ref 13.0–17.0)
MCH: 31 pg (ref 26.0–34.0)
MCHC: 34.3 g/dL (ref 30.0–36.0)
MCV: 90.3 fL (ref 80.0–100.0)
Platelets: 186 10*3/uL (ref 150–400)
RBC: 4.97 MIL/uL (ref 4.22–5.81)
RDW: 11.9 % (ref 11.5–15.5)
WBC: 9.5 10*3/uL (ref 4.0–10.5)
nRBC: 0 % (ref 0.0–0.2)

## 2023-03-08 LAB — URINALYSIS, ROUTINE W REFLEX MICROSCOPIC
Bacteria, UA: NONE SEEN
Bilirubin Urine: NEGATIVE
Glucose, UA: NEGATIVE mg/dL
Ketones, ur: NEGATIVE mg/dL
Leukocytes,Ua: NEGATIVE
Nitrite: NEGATIVE
Protein, ur: NEGATIVE mg/dL
Specific Gravity, Urine: 1.017 (ref 1.005–1.030)
pH: 6 (ref 5.0–8.0)

## 2023-03-08 LAB — TROPONIN I (HIGH SENSITIVITY)
Troponin I (High Sensitivity): 7 ng/L (ref <18)
Troponin I (High Sensitivity): 7 ng/L (ref <18)

## 2023-03-08 NOTE — ED Provider Notes (Signed)
[place] EMERGENCY DEPARTMENT AT [facility] Provider Note   CSN: [id number] Arrival date & time: 03/08/23  0141     History  Chief Complaint  Patient presents with   Flank Pain    Ryan Meyers is a 83 y.o. male.  83 year old male with a history of prostate cancer presents to the emergency department for evaluation.  States that he developed a left posterior chest pain which began around 0130.  Symptoms lasted 3 to 4 minutes before spontaneously resolving.  Denies any radiation of the pain or worsening pain with breathing or movement.  Further denies associated anterior chest pain, diaphoresis, jaw pain, lower extremity numbness or paresthesias, abdominal pain, shortness of breath.  Does report that his left arm "does not feel quite right".  Has recently been checking his blood pressure regularly.  Presented to [facility] ED 5 days ago for asymptomatic hypertension.  Blood pressure during this visit in the 160s systolic.  He was given a tablet of Norvasc at his ED visit and instructed to follow-up with his primary care doctor.  Subsequently was seen at a walk-in clinic on Monday for random systolic blood pressure readings ranging from 180-200.  He has since been taking 5 mg Norvasc daily as well as 12.5 mg hydrochlorothiazide daily with full compliance.  No prior hx of ACS or coronary intervention, per patient.  States that he is presently asymptomatic.  The history is provided by the patient and the spouse. No language interpreter was used.  Flank Pain       Home Medications Prior to Admission medications   Medication Sig Start Date End Date Taking? Authorizing Provider  amLODipine (NORVASC) 5 MG tablet Take 1 tablet (5 mg total) by mouth daily. 08/30/14   Rai, Delene Ruffini, MD  HYDROcodone-acetaminophen (NORCO/VICODIN) 5-325 MG tablet Take 1-2 tablets by mouth every 4 (four) hours as needed. 02/15/19   Rolan Bucco, MD  ondansetron (ZOFRAN ODT) 4 MG disintegrating  tablet 4mg  ODT q4 hours prn nausea/vomit 02/15/19   Rolan Bucco, MD  pantoprazole (PROTONIX) 40 MG tablet Take 1 tablet (40 mg total) by mouth daily. 08/30/14   Rai, Delene Ruffini, MD  simvastatin (ZOCOR) 20 MG tablet Take 20 mg by mouth at bedtime.    [provider]  tamsulosin (FLOMAX) 0.4 MG CAPS capsule Take 1 capsule (0.4 mg total) by mouth daily. 02/15/19   Rolan Bucco, MD      Allergies    Patient has no known allergies.    Review of Systems   Review of Systems  Genitourinary:  Positive for flank pain.  Ten systems reviewed and are negative for acute change, except as noted in the HPI.    Physical Exam Updated Vital Signs BP (!) 162/81 (BP Location: Right Arm)   Pulse 70   Temp 98.4 F (36.9 C) (Oral)   Resp 17   Ht 5\' 9"  (1.753 m)   Wt 65.8 kg   SpO2 100%   BMI 21.42 kg/m   Physical Exam Vitals and nursing note reviewed.  Constitutional:      General: He is not in acute distress.    Appearance: He is well-developed. He is not diaphoretic.     Comments: Nontoxic appearing and in NAD  HENT:     Head: Normocephalic and atraumatic.  Eyes:     General: No scleral icterus.    Conjunctiva/sclera: Conjunctivae normal.  Cardiovascular:     Rate and Rhythm: Normal rate and regular rhythm.  Pulses: Normal pulses.  Pulmonary:     Effort: Pulmonary effort is normal. No respiratory distress.     Breath sounds: No stridor. No wheezing.     Comments: Lungs CTAB. Respirations even and unlabored. Musculoskeletal:        General: Normal range of motion.     Cervical back: Normal range of motion.  Skin:    General: Skin is warm and dry.     Coloration: Skin is not pale.     Findings: No erythema or rash.  Neurological:     Mental Status: He is alert and oriented to person, place, and time.     Coordination: Coordination normal.  Psychiatric:        Behavior: Behavior normal.     ED Results / Procedures / Treatments   Labs (all labs ordered are listed, but  only abnormal results are displayed) Labs Reviewed  COMPREHENSIVE METABOLIC PANEL - Abnormal; Notable for the following components:      Result Value   Glucose, Bld 122 (*)    All other components within normal limits  URINALYSIS, ROUTINE W REFLEX MICROSCOPIC - Abnormal; Notable for the following components:   Hgb urine dipstick SMALL (*)    All other components within normal limits  CBC  TROPONIN I (HIGH SENSITIVITY)    EKG None  Radiology DG Chest 2 View Result Date: 03/08/2023 CLINICAL DATA:  83 year old male with chest pain, back pain. EXAM: CHEST - 2 VIEW COMPARISON:  CTA chest 08/29/2014 and earlier. FINDINGS: Emphysema demonstrated on the prior CTA. Large lung volumes, increased compared to 2016. Mild tortuosity of the thoracic aorta. Other mediastinal contours are within normal limits. Visualized tracheal air column is within normal limits. No pneumothorax, pulmonary edema, pleural effusion or consolidation. Small chronic metallic clips in the lateral basal segment of the right lower lobe are unchanged, possibly from previous bronchoscopic biopsy. No acute lung opacity identified. No acute osseous abnormality identified. Negative visible bowel gas. IMPRESSION: Emphysema ([id number]) with some progression of pulmonary hyperinflation since 2016. No acute cardiopulmonary abnormality. Electronically Signed   By: Odessa Fleming M.D.   On: 03/08/2023 05:26    Procedures Procedures    Medications Ordered in ED Medications - No data to display  ED Course/ Medical Decision Making/ A&P                                 Medical Decision Making Amount and/or Complexity of Data Reviewed Radiology: ordered. ECG/medicine tests: ordered.   This patient presents to the ED for concern of posterior L chest pain, this involves an extensive number of treatment options, and is a complaint that carries with it a high risk of complications and morbidity.  The differential diagnosis includes ACS vs PTX  vs PNA vs kidney stone vs pleural effusion vs MSK etiology   Co morbidities that complicate the patient evaluation  Prostate cancer   Additional history obtained:  Additional history obtained from wife, at bedside   Lab Tests:  I Ordered, and personally interpreted labs.  The pertinent results include:  CBC, CMP, UA negative. Troponin negative x1. Delta troponin pending.   Imaging Studies ordered:  I ordered imaging studies including CXR  I independently visualized and interpreted imaging which showed emphysematous changes without acute cardiopulmonary abnormality I agree with the radiologist interpretation   Cardiac Monitoring:  The patient was maintained on a cardiac monitor.  I personally viewed and interpreted the  cardiac monitored which showed an underlying rhythm of: NSR   Medicines ordered and prescription drug management:  I have reviewed the patients home medicines and have made adjustments as needed   Problem List / ED Course:  Work up reassuring, thus far. Pending delta troponin. BP readings stable while observed in the ED.   Reevaluation:  After the interventions noted above, I reevaluated the patient and found that they have :stayed the same   Social Determinants of Health:  Good social support; spouse at bedside   Dispostion:  Care signed out to [place], PA-C pending delta troponin         Final Clinical Impression(s) / ED Diagnoses Final diagnoses:  Hypertension not at goal  Atypical chest pain    Rx / DC Orders ED Discharge Orders     None         Antony Madura, PA-C 03/08/23 [id number]    Dione Booze, MD 03/08/23 [phone]

## 2023-03-08 NOTE — ED Provider Notes (Signed)
83 year old male recently diagnosed with high blood pressure and started on medication, had an episode of left scapular pain this evening which only lasted a few minutes before resolving.  There were no associated symptoms.  On exam, lungs are clear, heart has regular rate and rhythm, there is no chest wall tenderness.  Blood pressure is only moderately elevated.  Although very atypical, will evaluate for ACS with ECG and troponin x 2.  ED ECG REPORT   Date: 03/08/2023  Rate: 69  Rhythm: normal sinus rhythm  QRS Axis: left  Intervals: normal  ST/T Wave abnormalities: nonspecific ST changes  Conduction Disutrbances:none  Narrative Interpretation: Significant artifact, left axis deviation.  When compared paired with ECG of 03/03/2023, no significant changes are seen.  Of note, computer rhythm diagnosis is atrial flutter-I disagree and believe that the rhythm is normal sinus.  Old EKG Reviewed: unchanged  I have personally reviewed the EKG tracing and disagree with the computerized printout as noted.   Dione Booze, MD 03/08/23 [phone]

## 2023-03-08 NOTE — ED Notes (Signed)
Patient transported to X-ray 

## 2023-03-08 NOTE — ED Triage Notes (Signed)
The pt is c/o lt flank pain  15 minutes ago  he was seen at [facility] this past Saturday  for hypertension [facility] something different since then

## 2023-03-08 NOTE — Discharge Instructions (Addendum)
You were seen in the ER today for concerns of elevated blood pressure readings and atypical pain. Your labs and EKG were thankfully reassuring. I am unsure if the pain you felt was due to a muscular spasm as your heart workup was reassuring. Please follow up regarding your blood pressure with your primary care provider. Return to the ER for new or worsening symptoms.

## 2023-03-08 NOTE — ED Provider Notes (Signed)
Patient is handoff from [place], [place]. In short, has been tracking BP at home and concerned for continued BP readings, primarily systolic. Asymptomatic. Was started on hydrochlorothiazide 12.5 and was previously on Norvasc 5. Here today because of a 3-4 minute episode of left flank/shoulder pain which is atypical for him. Plan is for delta troponin and if flat, discharge home with instructions to continue BP regiment and follow up with PCP. Physical Exam  BP (!) 162/81 (BP Location: Right Arm)   Pulse 70   Temp 98.4 F (36.9 C) (Oral)   Resp 17   Ht 5\' 9"  (1.753 m)   Wt 65.8 kg   SpO2 100%   BMI 21.42 kg/m   Physical Exam Vitals and nursing note reviewed.  Constitutional:      General: He is not in acute distress.    Appearance: He is well-developed. He is not diaphoretic.  HENT:     Head: Normocephalic and atraumatic.  Eyes:     General: No scleral icterus.    Conjunctiva/sclera: Conjunctivae normal.  Cardiovascular:     Rate and Rhythm: Normal rate and regular rhythm.     Pulses: Normal pulses.  Pulmonary:     Effort: Pulmonary effort is normal. No respiratory distress.     Breath sounds: No stridor. No wheezing.     Comments: Lungs CTAB. Respirations even and unlabored. Musculoskeletal:        General: Normal range of motion.     Cervical back: Normal range of motion.     Comments: No reproducible pain in the left scapula with movement or palpation of the area.  Skin:    General: Skin is warm and dry.     Coloration: Skin is not pale.     Findings: No erythema or rash.  Neurological:     Mental Status: He is alert and oriented to person, place, and time.     Coordination: Coordination normal.  Psychiatric:        Behavior: Behavior normal.     Procedures  Procedures  ED Course / MDM    Medical Decision Making Amount and/or Complexity of Data Reviewed Radiology: ordered. ECG/medicine tests: ordered.   Patient is handoff from [place], [place]. In short, has been  tracking BP at home and concerned for continued BP readings, primarily systolic. Asymptomatic. Was started on hydrochlorothiazide 12.5 and was previously on Norvasc 5. Here today because of a 3-4 minute episode of left flank/shoulder pain which is atypical for him. Plan is for delta troponin and if flat, discharge home with instructions to continue BP regiment and follow up with PCP as long as remaining asymptomatic.  Deltra troponin is flat. Informed patient of reassuring workup. Will have patient follow up with PCP regarding further BP management if BP still not remaining at goal over the next few weeks of continued use of new home BP meds. No adjustments made today to BP medications. Discharged home in stable condition.         Smitty Knudsen, PA-C 03/08/23 0809    Maia Plan, MD 03/08/23 [phone]

## 2023-03-09 DIAGNOSIS — I10 Essential (primary) hypertension: Secondary | ICD-10-CM | POA: Diagnosis not present

## 2023-09-12 DIAGNOSIS — H35362 Drusen (degenerative) of macula, left eye: Secondary | ICD-10-CM | POA: Diagnosis not present

## 2023-09-12 DIAGNOSIS — H43812 Vitreous degeneration, left eye: Secondary | ICD-10-CM | POA: Diagnosis not present

## 2023-10-19 DIAGNOSIS — H43813 Vitreous degeneration, bilateral: Secondary | ICD-10-CM | POA: Diagnosis not present

## 2023-10-31 DIAGNOSIS — Z23 Encounter for immunization: Secondary | ICD-10-CM | POA: Diagnosis not present
